# Patient Record
Sex: Female | Born: 1970 | Race: Black or African American | Hispanic: No | Marital: Single | Smoking: Never smoker
Health system: Southern US, Community
[De-identification: ages and names within clinical notes are randomized; demographics above are authoritative.]

## PROBLEM LIST (undated history)

## (undated) DIAGNOSIS — J45909 Unspecified asthma, uncomplicated: Secondary | ICD-10-CM

## (undated) DIAGNOSIS — E119 Type 2 diabetes mellitus without complications: Secondary | ICD-10-CM

## (undated) HISTORY — PX: TUBAL LIGATION: SHX77

---

## 2004-04-15 ENCOUNTER — Emergency Department: Payer: Self-pay | Admitting: Emergency Medicine

## 2006-08-04 ENCOUNTER — Emergency Department: Payer: Self-pay | Admitting: Emergency Medicine

## 2010-12-15 ENCOUNTER — Emergency Department: Payer: Self-pay | Admitting: Emergency Medicine

## 2011-10-19 ENCOUNTER — Emergency Department: Payer: Self-pay | Admitting: Emergency Medicine

## 2011-10-19 LAB — COMPREHENSIVE METABOLIC PANEL
Anion Gap: 7 (ref 7–16)
BUN: 8 mg/dL (ref 7–18)
Calcium, Total: 9 mg/dL (ref 8.5–10.1)
Chloride: 105 mmol/L (ref 98–107)
Creatinine: 0.78 mg/dL (ref 0.60–1.30)
EGFR (African American): >60
EGFR (Non-African Amer.): >60
Osmolality: 275 (ref 275–301)
SGOT(AST): 16 U/L (ref 15–37)
SGPT (ALT): 16 U/L (ref 12–78)
Sodium: 139 mmol/L (ref 136–145)
Total Protein: 8.8 g/dL — ABNORMAL HIGH (ref 6.4–8.2)

## 2011-10-19 LAB — CBC
MCH: 21.6 pg — ABNORMAL LOW (ref 26.0–34.0)
MCHC: 31.1 g/dL — ABNORMAL LOW (ref 32.0–36.0)
MCV: 70 fL — ABNORMAL LOW (ref 80–100)
Platelet: 402 10*3/uL (ref 150–440)
RBC: 4.56 10*6/uL (ref 3.80–5.20)
WBC: 10 10*3/uL (ref 3.6–11.0)

## 2011-10-19 LAB — URINALYSIS, COMPLETE
Bilirubin,UR: NEGATIVE
Blood: NEGATIVE
Glucose,UR: NEGATIVE mg/dL (ref 0–75)
Ketone: NEGATIVE
RBC,UR: NONE SEEN /HPF (ref 0–5)
Squamous Epithelial: 1
WBC UR: 1 /HPF (ref 0–5)

## 2011-10-19 LAB — LIPASE, BLOOD: Lipase: 52 U/L — ABNORMAL LOW (ref 73–393)

## 2012-02-06 ENCOUNTER — Emergency Department: Payer: Self-pay | Admitting: Emergency Medicine

## 2012-03-11 ENCOUNTER — Emergency Department: Payer: Self-pay | Admitting: Emergency Medicine

## 2012-05-11 ENCOUNTER — Emergency Department: Payer: Self-pay

## 2012-10-28 ENCOUNTER — Emergency Department: Payer: Self-pay | Admitting: Emergency Medicine

## 2012-10-28 LAB — BASIC METABOLIC PANEL
Anion Gap: 5 — ABNORMAL LOW (ref 7–16)
BUN: 8 mg/dL (ref 7–18)
Chloride: 102 mmol/L (ref 98–107)
Co2: 28 mmol/L (ref 21–32)
EGFR (African American): >60
Sodium: 135 mmol/L — ABNORMAL LOW (ref 136–145)

## 2012-10-28 LAB — URINALYSIS, COMPLETE
Bilirubin,UR: NEGATIVE
Glucose,UR: NEGATIVE mg/dL (ref 0–75)
Ketone: NEGATIVE
Nitrite: NEGATIVE
Ph: 7 (ref 4.5–8.0)
Protein: NEGATIVE
RBC,UR: NONE SEEN /HPF (ref 0–5)
Specific Gravity: 1.01 (ref 1.003–1.030)
Squamous Epithelial: 5

## 2012-10-28 LAB — CBC
HCT: 28.9 % — ABNORMAL LOW (ref 35.0–47.0)
HGB: 9 g/dL — ABNORMAL LOW (ref 12.0–16.0)
MCH: 21.1 pg — ABNORMAL LOW (ref 26.0–34.0)
MCHC: 31.2 g/dL — ABNORMAL LOW (ref 32.0–36.0)
MCV: 67 fL — ABNORMAL LOW (ref 80–100)
RDW: 19.6 % — ABNORMAL HIGH (ref 11.5–14.5)
WBC: 16.5 10*3/uL — ABNORMAL HIGH (ref 3.6–11.0)

## 2013-01-31 ENCOUNTER — Emergency Department: Payer: Self-pay | Admitting: Emergency Medicine

## 2019-03-23 ENCOUNTER — Encounter: Payer: Self-pay | Admitting: Intensive Care

## 2019-03-23 ENCOUNTER — Emergency Department: Payer: Medicaid Other

## 2019-03-23 ENCOUNTER — Other Ambulatory Visit: Payer: Self-pay

## 2019-03-23 ENCOUNTER — Emergency Department
Admission: EM | Admit: 2019-03-23 | Discharge: 2019-03-23 | Disposition: A | Discharge status: Home / Self Care | Payer: Medicaid Other | Attending: Student in an Organized Health Care Education/Training Program | Admitting: Student in an Organized Health Care Education/Training Program

## 2019-03-23 DIAGNOSIS — S93402A Sprain of unspecified ligament of left ankle, initial encounter: Secondary | ICD-10-CM

## 2019-03-23 DIAGNOSIS — Y929 Unspecified place or not applicable: Secondary | ICD-10-CM | POA: Insufficient documentation

## 2019-03-23 DIAGNOSIS — E119 Type 2 diabetes mellitus without complications: Secondary | ICD-10-CM | POA: Insufficient documentation

## 2019-03-23 DIAGNOSIS — Z7984 Long term (current) use of oral hypoglycemic drugs: Secondary | ICD-10-CM | POA: Insufficient documentation

## 2019-03-23 DIAGNOSIS — Z7982 Long term (current) use of aspirin: Secondary | ICD-10-CM | POA: Insufficient documentation

## 2019-03-23 DIAGNOSIS — Y999 Unspecified external cause status: Secondary | ICD-10-CM | POA: Insufficient documentation

## 2019-03-23 DIAGNOSIS — Y939 Activity, unspecified: Secondary | ICD-10-CM | POA: Insufficient documentation

## 2019-03-23 DIAGNOSIS — W010XXA Fall on same level from slipping, tripping and stumbling without subsequent striking against object, initial encounter: Secondary | ICD-10-CM | POA: Insufficient documentation

## 2019-03-23 DIAGNOSIS — Z79899 Other long term (current) drug therapy: Secondary | ICD-10-CM | POA: Insufficient documentation

## 2019-03-23 DIAGNOSIS — J45909 Unspecified asthma, uncomplicated: Secondary | ICD-10-CM | POA: Insufficient documentation

## 2019-03-23 HISTORY — DX: Type 2 diabetes mellitus without complications: E11.9

## 2019-03-23 HISTORY — DX: Unspecified asthma, uncomplicated: J45.909

## 2019-03-23 MED ORDER — TRAMADOL HCL 50 MG PO TABS: 50.0000 mg | ORAL_TABLET | Freq: Two times a day (BID) | ORAL | 0 refills | Status: AC | PRN

## 2019-03-23 MED ORDER — IBUPROFEN 600 MG PO TABS: 600.0000 mg | ORAL_TABLET | Freq: Three times a day (TID) | ORAL | 0 refills | Status: DC | PRN

## 2019-03-23 MED ORDER — IBUPROFEN 600 MG PO TABS
600.0000 mg | ORAL_TABLET | Freq: Once | ORAL | Status: AC
  Administered 2019-03-23: 600 mg via ORAL
  Filled 2019-03-23: qty 1

## 2019-03-23 NOTE — ED Triage Notes (Signed)
Patient reports falling earlier today injuring her left knee and ankle. Ambulatory with limp. No previous hx of knee or ankle issues

## 2019-03-23 NOTE — Discharge Instructions (Signed)
Follow discharge care instruction wear splint for 2 to 3 days as needed.

## 2019-03-23 NOTE — ED Provider Notes (Signed)
[facility] Emergency Department Provider Note   ____________________________________________   First MD Initiated Contact with Patient 03/23/19 1724     (approximate)  I have reviewed the triage vital signs and the nursing notes.   HISTORY  Chief Complaint Ankle Pain (left) and Knee Pain (left)    HPI Courtney Le is a 49 y.o. female patient complain left ankle pain secondary to a slip and fall earlier today.  Patient the pain increased with weightbearing.  Patient stated increased edema is very difficult to wear her shoe.  Patient denies loss of sensation.  Patient denies any other injury related to the fall.  Patient rates pain as 8/10.  Patient described the pain as "aching".  No palliative measure for complaint.         Past Medical History:  Diagnosis Date  . Asthma   . Diabetes mellitus without complication ([place])     There are no problems to display for this patient.   History reviewed. No pertinent surgical history.  Prior to Admission medications   Medication Sig Start Date End Date Taking? Authorizing Provider  albuterol (VENTOLIN HFA) 108 (90 Base) MCG/ACT inhaler Inhale 2 puffs into the lungs every 6 (six) hours as needed for wheezing or shortness of breath.   Yes [provider]  aspirin EC 81 MG tablet Take 81 mg by mouth daily.   Yes [provider]  atorvastatin (LIPITOR) 10 MG tablet Take 10 mg by mouth daily.   Yes [provider]  losartan (COZAAR) 100 MG tablet Take 100 mg by mouth daily.   Yes [provider]  metFORMIN (GLUCOPHAGE) 500 MG tablet Take 500 mg by mouth 2 (two) times daily with a meal.   Yes [provider]  ibuprofen (ADVIL) 600 MG tablet Take 1 tablet (600 mg total) by mouth every 8 (eight) hours as needed. 03/23/19   Sable Feil, PA-C  traMADol (ULTRAM) 50 MG tablet Take 1 tablet (50 mg total) by mouth every 12 (twelve) hours as needed for up to 3 days. 03/23/19  03/26/19  Sable Feil, PA-C    Allergies Penicillins  History reviewed. No pertinent family history.  Social History Social History   Tobacco Use  . Smoking status: Never Smoker  . Smokeless tobacco: Never Used  Substance Use Topics  . Alcohol use: Never  . Drug use: Never    Review of Systems Constitutional: No fever/chills Eyes: No visual changes. ENT: No sore throat. Cardiovascular: Denies chest pain. Respiratory: Denies shortness of breath. Gastrointestinal: No abdominal pain.  No nausea, no vomiting.  No diarrhea.  No constipation. Genitourinary: Negative for dysuria. Musculoskeletal: Left ankle pain. Skin: Negative for rash. Neurological: Negative for headaches, focal weakness or numbness. Endocrine:  Diabetes, hyperlipidemia, and hypertension. Allergic/Immunilogical: Penicillin ____________________________________________   PHYSICAL EXAM:  VITAL SIGNS: ED Triage Vitals [03/23/19 1719]  Enc Vitals Group     BP 132/76     Pulse Rate 100     Resp 14     Temp 98.6 F (37 C)     Temp Source Oral     SpO2 100 %     Weight 190 lb (86.2 kg)     Height 4\' 10"  (1.473 m)     Head Circumference      Peak Flow      Pain Score 8     Pain Loc      Pain Edu?      Excl. in [place]?  Constitutional: Alert and oriented. Well appearing and in no acute distress. Cardiovascular: Normal rate, regular rhythm. Grossly normal heart sounds.  Good peripheral circulation. Respiratory: Normal respiratory effort.  No retractions. Lungs CTAB. Musculoskeletal: No lower extremity tenderness nor edema.  No joint effusions. Neurologic:  Normal speech and language. No gross focal neurologic deficits are appreciated. No gait instability. Skin:  Skin is warm, dry and intact. No rash noted. Psychiatric: Mood and affect are normal. Speech and behavior are normal.  ____________________________________________   LABS (all labs ordered are listed, but only abnormal results are  displayed)  Labs Reviewed - No data to display ____________________________________________  EKG   ____________________________________________  RADIOLOGY  ED MD interpretation:    Official radiology report(s): DG Ankle Complete Left  Result Date: 03/23/2019 CLINICAL DATA:  Left ankle pain after fall EXAM: LEFT ANKLE COMPLETE - 3+ VIEW COMPARISON:  None. FINDINGS: There is no evidence of fracture, dislocation, or joint effusion. Small bidirectional calcaneal enthesophytes. There is no evidence of arthropathy or other focal bone abnormality. There is soft tissue swelling about the ankle. IMPRESSION: Soft tissue swelling without acute fracture or malalignment. Electronically Signed   By: Duanne Guess D.O.   On: 03/23/2019 18:21    ____________________________________________   PROCEDURES  Procedure(s) performed (including Critical Care):  Procedures   ____________________________________________   INITIAL IMPRESSION / ASSESSMENT AND PLAN / ED COURSE  As part of my medical decision making, I reviewed the following data within the electronic MEDICAL RECORD NUMBER      Patient presents with left ankle pain edema secondary to a slip and fall today.  Discussed x-ray findings with patient.  Physical exam consistent with left ankle sprain.  Patient placed in ankle stirrup splint and given discharge care instruction.  Take medication as directed.  Advised to follow-up if no improvement in 3 to 5 days.   Courtney Le was evaluated in Emergency Department on 03/23/2019 for the symptoms described in the history of present illness. She was evaluated in the context of the global COVID-19 pandemic, which necessitated consideration that the patient might be at risk for infection with the SARS-CoV-2 virus that causes COVID-19. Institutional protocols and algorithms that pertain to the evaluation of patients at risk for COVID-19 are in a state of rapid change based on information released by  regulatory bodies including the CDC and federal and state organizations. These policies and algorithms were followed during the patient's care in the ED.       ____________________________________________   FINAL CLINICAL IMPRESSION(S) / ED DIAGNOSES  Final diagnoses:  Sprain of left ankle, unspecified ligament, initial encounter     ED Discharge Orders         Ordered    ibuprofen (ADVIL) 600 MG tablet  Every 8 hours PRN     03/23/19 1830    traMADol (ULTRAM) 50 MG tablet  Every 12 hours PRN     03/23/19 1830           Note:  This document was prepared using Dragon voice recognition software and may include unintentional dictation errors.    Joni Reining, PA-C 03/23/19 1831    Willy Eddy, MD 03/23/19 1850

## 2019-03-23 NOTE — ED Notes (Signed)
See triage note  Presents s/p fall   States she fell coming out of her house  Twisted left ankle  Also has some bruising to knee  Good pulses

## 2020-07-29 ENCOUNTER — Emergency Department
Admission: EM | Admit: 2020-07-29 | Discharge: 2020-07-29 | Disposition: A | Discharge status: Home / Self Care | Payer: PRIVATE HEALTH INSURANCE | Attending: Emergency Medicine | Admitting: Emergency Medicine

## 2020-07-29 ENCOUNTER — Other Ambulatory Visit: Payer: Self-pay

## 2020-07-29 ENCOUNTER — Encounter: Payer: Self-pay | Admitting: Emergency Medicine

## 2020-07-29 ENCOUNTER — Emergency Department: Payer: PRIVATE HEALTH INSURANCE

## 2020-07-29 DIAGNOSIS — Z7984 Long term (current) use of oral hypoglycemic drugs: Secondary | ICD-10-CM | POA: Diagnosis not present

## 2020-07-29 DIAGNOSIS — J45909 Unspecified asthma, uncomplicated: Secondary | ICD-10-CM | POA: Insufficient documentation

## 2020-07-29 DIAGNOSIS — E119 Type 2 diabetes mellitus without complications: Secondary | ICD-10-CM | POA: Diagnosis not present

## 2020-07-29 DIAGNOSIS — J9801 Acute bronchospasm: Secondary | ICD-10-CM

## 2020-07-29 DIAGNOSIS — R059 Cough, unspecified: Secondary | ICD-10-CM | POA: Diagnosis present

## 2020-07-29 DIAGNOSIS — Z7982 Long term (current) use of aspirin: Secondary | ICD-10-CM | POA: Diagnosis not present

## 2020-07-29 MED ORDER — IPRATROPIUM-ALBUTEROL 0.5-2.5 (3) MG/3ML IN SOLN
3.0000 mL | Freq: Once | RESPIRATORY_TRACT | Status: AC
  Administered 2020-07-29: 3 mL via RESPIRATORY_TRACT
  Filled 2020-07-29: qty 3

## 2020-07-29 MED ORDER — METHYLPREDNISOLONE 4 MG PO TBPK: ORAL_TABLET | ORAL | 0 refills | Status: DC

## 2020-07-29 MED ORDER — PSEUDOEPH-BROMPHEN-DM 30-2-10 MG/5ML PO SYRP: 5.0000 mL | ORAL_SOLUTION | Freq: Four times a day (QID) | ORAL | 0 refills | Status: DC | PRN

## 2020-07-29 MED ORDER — ALBUTEROL SULFATE HFA 108 (90 BASE) MCG/ACT IN AERS: 2.0000 | INHALATION_SPRAY | Freq: Four times a day (QID) | RESPIRATORY_TRACT | 2 refills | Status: AC | PRN

## 2020-07-29 NOTE — Discharge Instructions (Addendum)
Read and follow discharge care instructions.  Take medication as directed.  Advised to closely monitor blood sugar while taking steroids.

## 2020-07-29 NOTE — ED Notes (Signed)
See triage note  Presents with some SOB and wheezing  States her AC went out at home on Saturday  It triggered her asthma  Has used her inhalers and svn w/o much relief

## 2020-07-29 NOTE — ED Provider Notes (Signed)
[facility] Emergency Department Provider Note   ____________________________________________   Event Date/Time   First MD Initiated Contact with Patient 07/29/20 [id number]     (approximate)  I have reviewed the triage vital signs and the nursing notes.   HISTORY  Chief Complaint Wheezing    HPI Courtney Le is a 50 y.o. female patient presents with cough and wheezing began last night.  Patient has a history of asthma.  Patient not use her inhaler.         Past Medical History:  Diagnosis Date  . Asthma   . Diabetes mellitus without complication ([facility])     There are no problems to display for this patient.   History reviewed. No pertinent surgical history.  Prior to Admission medications   Medication Sig Start Date End Date Taking? Authorizing Provider  albuterol (VENTOLIN HFA) 108 (90 Base) MCG/ACT inhaler Inhale 2 puffs into the lungs every 6 (six) hours as needed for wheezing or shortness of breath. 07/29/20  Yes Joni Reining, PA-C  brompheniramine-pseudoephedrine-DM 30-2-10 MG/5ML syrup Take 5 mLs by mouth 4 (four) times daily as needed. 07/29/20  Yes Joni Reining, PA-C  methylPREDNISolone (MEDROL DOSEPAK) 4 MG TBPK tablet Take Tapered dose as directed 07/29/20  Yes Joni Reining, PA-C  albuterol (VENTOLIN HFA) 108 (90 Base) MCG/ACT inhaler Inhale 2 puffs into the lungs every 6 (six) hours as needed for wheezing or shortness of breath.    [provider]  aspirin EC 81 MG tablet Take 81 mg by mouth daily.    [provider]  atorvastatin (LIPITOR) 10 MG tablet Take 10 mg by mouth daily.    [provider]  ibuprofen (ADVIL) 600 MG tablet Take 1 tablet (600 mg total) by mouth every 8 (eight) hours as needed. 03/23/19   Joni Reining, PA-C  losartan (COZAAR) 100 MG tablet Take 100 mg by mouth daily.    [provider]  metFORMIN (GLUCOPHAGE) 500 MG tablet Take 500 mg by mouth 2 (two) times daily with a  meal.    [provider]    Allergies Penicillins  No family history on file.  Social History Social History   Tobacco Use  . Smoking status: Never Smoker  . Smokeless tobacco: Never Used  Substance Use Topics  . Alcohol use: Never  . Drug use: Never    Review of Systems Constitutional: No fever/chills Eyes: No visual changes. ENT: No sore throat. Cardiovascular: Denies chest pain. Respiratory: Cough and wheezing. Gastrointestinal: No abdominal pain.  No nausea, no vomiting.  No diarrhea.  No constipation. Genitourinary: Negative for dysuria. Musculoskeletal: Negative for back pain. Skin: Negative for rash. Neurological: Negative for headaches, focal weakness or numbness. Endocrine:  Diabetes, hyperlipidemia, hypertension. Allergic/Immunilogical: Penicillin ____________________________________________   PHYSICAL EXAM:  VITAL SIGNS: ED Triage Vitals  Enc Vitals Group     BP 07/29/20 0733 138/90     Pulse Rate 07/29/20 0733 99     Resp 07/29/20 0733 18     Temp 07/29/20 0733 98.4 F (36.9 C)     Temp Source 07/29/20 0733 Oral     SpO2 07/29/20 0733 96 %     Weight 07/29/20 0725 190 lb 0.6 oz (86.2 kg)     Height 07/29/20 0725 4\' 10"  (1.473 m)     Head Circumference --      Peak Flow --      Pain Score 07/29/20 0725 0     Pain Loc --  Pain Edu? --      Excl. in [place]? --     Constitutional: Alert and oriented. Well appearing and in no acute distress. Eyes: Conjunctivae are normal. PERRL. EOMI. Head: Atraumatic. Nose: No congestion/rhinnorhea. Mouth/Throat: Mucous membranes are moist.  Oropharynx non-erythematous. Neck: No stridor.  Hematological/Lymphatic/Immunilogical: No cervical lymphadenopathy. Cardiovascular: Normal rate, regular rhythm. Grossly normal heart sounds.  Good peripheral circulation. Respiratory: Normal respiratory effort.  No retractions. Lungs expiratory wheezing.  Gastrointestinal: Soft and nontender.  distention secondary to  body habitus.. No abdominal bruits. No CVA tenderness. Genitourinary: Deferred Neurologic:  Normal speech and language. No gross focal neurologic deficits are appreciated. No gait instability. Skin:  Skin is warm, dry and intact. No rash noted. Psychiatric: Mood and affect are normal. Speech and behavior are normal.  ____________________________________________   LABS (all labs ordered are listed, but only abnormal results are displayed)  Labs Reviewed - No data to display ____________________________________________  EKG   ____________________________________________  RADIOLOGY I, Joni Reining, personally viewed and evaluated these images (plain radiographs) as part of my medical decision making, as well as reviewing the written report by the radiologist.  ED MD interpretation:    Official radiology report(s): DG Chest Portable 1 View  Result Date: 07/29/2020 CLINICAL DATA:  Cough and wheezing.  History of asthma. EXAM: PORTABLE CHEST 1 VIEW COMPARISON:  Radiographs 01/31/2013. FINDINGS: 0800 hours. Interval development of mild cardiac enlargement. The mediastinal contours are stable. The pulmonary vascularity is normal, and the lungs are clear. There is no pleural effusion or pneumothorax. The bones appear unremarkable. IMPRESSION: Mild cardiac enlargement.  Clear lungs. Electronically Signed   By: Carey Bullocks M.D.   On: 07/29/2020 08:35    ____________________________________________   PROCEDURES  Procedure(s) performed (including Critical Care):  Procedures   ____________________________________________   INITIAL IMPRESSION / ASSESSMENT AND PLAN / ED COURSE  As part of my medical decision making, I reviewed the following data within the electronic MEDICAL RECORD NUMBER         Patient presents with wheezing started last night.  Patient also is out of her albuterol inhaler.  Discussed no acute findings on chest x-ray.  Patient's bonded well to 1 DuoNeb treatment.   Patient given discharge care instruction advised take medication as directed.  Patient advised to follow-up with PCP.      ____________________________________________   FINAL CLINICAL IMPRESSION(S) / ED DIAGNOSES  Final diagnoses:  Bronchospasm     ED Discharge Orders         Ordered    brompheniramine-pseudoephedrine-DM 30-2-10 MG/5ML syrup  4 times daily PRN        07/29/20 0850    methylPREDNISolone (MEDROL DOSEPAK) 4 MG TBPK tablet        07/29/20 0850    albuterol (VENTOLIN HFA) 108 (90 Base) MCG/ACT inhaler  Every 6 hours PRN        07/29/20 0850          *Please note:  CORRIE REDER was evaluated in Emergency Department on 07/29/2020 for the symptoms described in the history of present illness. She was evaluated in the context of the global COVID-19 pandemic, which necessitated consideration that the patient might be at risk for infection with the SARS-CoV-2 virus that causes COVID-19. Institutional protocols and algorithms that pertain to the evaluation of patients at risk for COVID-19 are in a state of rapid change based on information released by regulatory bodies including the CDC and federal and state organizations. These policies and algorithms  were followed during the patient's care in the ED.  Some ED evaluations and interventions may be delayed as a result of limited staffing during and the pandemic.*   Note:  This document was prepared using Dragon voice recognition software and may include unintentional dictation errors.    Joni Reining, PA-C 07/29/20 9211    Dionne Bucy, MD 07/29/20 [id number]

## 2020-07-29 NOTE — ED Triage Notes (Signed)
C/O wheezing from asthma since last night.  Has not used inhaler.  AAOx3.  Skin warm and dry. NAD

## 2020-11-06 ENCOUNTER — Encounter: Payer: Self-pay | Admitting: Emergency Medicine

## 2020-11-06 ENCOUNTER — Emergency Department: Payer: PRIVATE HEALTH INSURANCE

## 2020-11-06 ENCOUNTER — Other Ambulatory Visit: Payer: Self-pay

## 2020-11-06 ENCOUNTER — Emergency Department
Admission: EM | Admit: 2020-11-06 | Discharge: 2020-11-06 | Disposition: A | Discharge status: Home / Self Care | Payer: PRIVATE HEALTH INSURANCE | Attending: Emergency Medicine | Admitting: Emergency Medicine

## 2020-11-06 DIAGNOSIS — E119 Type 2 diabetes mellitus without complications: Secondary | ICD-10-CM | POA: Insufficient documentation

## 2020-11-06 DIAGNOSIS — N898 Other specified noninflammatory disorders of vagina: Secondary | ICD-10-CM | POA: Diagnosis present

## 2020-11-06 DIAGNOSIS — Z79899 Other long term (current) drug therapy: Secondary | ICD-10-CM | POA: Insufficient documentation

## 2020-11-06 DIAGNOSIS — J45909 Unspecified asthma, uncomplicated: Secondary | ICD-10-CM | POA: Diagnosis not present

## 2020-11-06 DIAGNOSIS — B9689 Other specified bacterial agents as the cause of diseases classified elsewhere: Secondary | ICD-10-CM | POA: Insufficient documentation

## 2020-11-06 DIAGNOSIS — K5792 Diverticulitis of intestine, part unspecified, without perforation or abscess without bleeding: Secondary | ICD-10-CM | POA: Insufficient documentation

## 2020-11-06 DIAGNOSIS — R1031 Right lower quadrant pain: Secondary | ICD-10-CM

## 2020-11-06 DIAGNOSIS — A599 Trichomoniasis, unspecified: Secondary | ICD-10-CM | POA: Diagnosis not present

## 2020-11-06 DIAGNOSIS — Z7984 Long term (current) use of oral hypoglycemic drugs: Secondary | ICD-10-CM | POA: Diagnosis not present

## 2020-11-06 DIAGNOSIS — Z7982 Long term (current) use of aspirin: Secondary | ICD-10-CM | POA: Insufficient documentation

## 2020-11-06 LAB — CBC WITH DIFFERENTIAL/PLATELET
Abs Immature Granulocytes: 0.02 10*3/uL (ref 0.00–0.07)
Basophils Absolute: 0.1 10*3/uL (ref 0.0–0.1)
Basophils Relative: 1 %
Eosinophils Absolute: 0.2 10*3/uL (ref 0.0–0.5)
Eosinophils Relative: 2 %
HCT: 38.3 % (ref 36.0–46.0)
Hemoglobin: 12.5 g/dL (ref 12.0–15.0)
Immature Granulocytes: 0 %
Lymphocytes Relative: 41 %
Lymphs Abs: 4.4 10*3/uL — ABNORMAL HIGH (ref 0.7–4.0)
MCH: 27.3 pg (ref 26.0–34.0)
MCHC: 32.6 g/dL (ref 30.0–36.0)
MCV: 83.6 fL (ref 80.0–100.0)
Monocytes Absolute: 1 10*3/uL (ref 0.1–1.0)
Monocytes Relative: 9 %
Neutro Abs: 5.1 10*3/uL (ref 1.7–7.7)
Neutrophils Relative %: 47 %
Platelets: 377 10*3/uL (ref 150–400)
RBC: 4.58 MIL/uL (ref 3.87–5.11)
RDW: 15.4 % (ref 11.5–15.5)
WBC: 10.7 10*3/uL — ABNORMAL HIGH (ref 4.0–10.5)
nRBC: 0 % (ref 0.0–0.2)

## 2020-11-06 LAB — URINALYSIS, ROUTINE W REFLEX MICROSCOPIC
Bilirubin Urine: NEGATIVE
Glucose, UA: 150 mg/dL — AB
Hgb urine dipstick: NEGATIVE
Ketones, ur: NEGATIVE mg/dL
Nitrite: NEGATIVE
Protein, ur: NEGATIVE mg/dL
Specific Gravity, Urine: 1.017 (ref 1.005–1.030)
pH: 8 (ref 5.0–8.0)

## 2020-11-06 LAB — CHLAMYDIA/NGC RT PCR (ARMC ONLY)
Chlamydia Tr: NOT DETECTED
N gonorrhoeae: NOT DETECTED

## 2020-11-06 LAB — COMPREHENSIVE METABOLIC PANEL
ALT: 14 U/L (ref 0–44)
AST: 12 U/L — ABNORMAL LOW (ref 15–41)
Albumin: 3.6 g/dL (ref 3.5–5.0)
Alkaline Phosphatase: 57 U/L (ref 38–126)
Anion gap: 10 (ref 5–15)
BUN: 9 mg/dL (ref 6–20)
CO2: 29 mmol/L (ref 22–32)
Calcium: 9.6 mg/dL (ref 8.9–10.3)
Chloride: 101 mmol/L (ref 98–111)
Creatinine, Ser: 0.73 mg/dL (ref 0.44–1.00)
GFR, Estimated: >60 mL/min (ref >60)
Glucose, Bld: 79 mg/dL (ref 70–99)
Potassium: 4.3 mmol/L (ref 3.5–5.1)
Sodium: 140 mmol/L (ref 135–145)
Total Bilirubin: 0.5 mg/dL (ref 0.3–1.2)
Total Protein: 8.2 g/dL — ABNORMAL HIGH (ref 6.5–8.1)

## 2020-11-06 LAB — WET PREP, GENITAL
Clue Cells Wet Prep HPF POC: NONE SEEN
Sperm: NONE SEEN
Yeast Wet Prep HPF POC: NONE SEEN

## 2020-11-06 LAB — PREGNANCY, URINE: Preg Test, Ur: NEGATIVE

## 2020-11-06 MED ORDER — IOHEXOL 350 MG/ML SOLN
100.0000 mL | Freq: Once | INTRAVENOUS | Status: AC | PRN
  Administered 2020-11-06: 100 mL via INTRAVENOUS

## 2020-11-06 MED ORDER — HYDROCODONE-ACETAMINOPHEN 5-325 MG PO TABS: 1.0000 | ORAL_TABLET | Freq: Four times a day (QID) | ORAL | 0 refills | Status: DC | PRN

## 2020-11-06 MED ORDER — METRONIDAZOLE 500 MG PO TABS: 500.0000 mg | ORAL_TABLET | Freq: Three times a day (TID) | ORAL | 0 refills | Status: AC

## 2020-11-06 MED ORDER — CIPROFLOXACIN HCL 500 MG PO TABS: 500.0000 mg | ORAL_TABLET | Freq: Two times a day (BID) | ORAL | 0 refills | Status: AC

## 2020-11-06 NOTE — ED Provider Notes (Signed)
[facility] Emergency Department Provider Note   ____________________________________________   Event Date/Time   First MD Initiated Contact with Patient 11/06/20 1040     (approximate)  I have reviewed the triage vital signs and the nursing notes.   HISTORY  Chief Complaint Abdominal Pain and Back Pain    HPI CHELA SUTPHEN is a 50 y.o. female who reports she had a boil that burst and gave her some discharge and itching this is gotten better but she still has a little discharge the boil was on the left side of her introitus as I understand and now she has some pain on the right extreme right lower quadrant of the abdomen.  Is tender to palpation but not through percussion.  There is no pain with rebound.  She still has a discharge present.  Pain is deep and achy occasionally sharp and radiates toward the back.  There is no CVA tenderness.  She has no dysuria urgency or frequency.  She has no diarrhea nausea vomiting or other complaints.     Past Medical History:  Diagnosis Date   Asthma    Diabetes mellitus without complication (HCC)     There are no problems to display for this patient.   History reviewed. No pertinent surgical history.  Prior to Admission medications   Medication Sig Start Date End Date Taking? Authorizing Provider  ciprofloxacin (CIPRO) 500 MG tablet Take 1 tablet (500 mg total) by mouth 2 (two) times daily for 10 days. 11/06/20 11/16/20 Yes Arnaldo Natal, MD  HYDROcodone-acetaminophen (NORCO/VICODIN) 5-325 MG tablet Take 1 tablet by mouth every 6 (six) hours as needed for moderate pain. 11/06/20  Yes Arnaldo Natal, MD  metroNIDAZOLE (FLAGYL) 500 MG tablet Take 1 tablet (500 mg total) by mouth 3 (three) times daily for 7 days. 11/06/20 11/13/20 Yes Arnaldo Natal, MD  albuterol (VENTOLIN HFA) 108 (90 Base) MCG/ACT inhaler Inhale 2 puffs into the lungs every 6 (six) hours as needed for wheezing or shortness of breath.    [provider]  albuterol (VENTOLIN HFA) 108 (90 Base) MCG/ACT inhaler Inhale 2 puffs into the lungs every 6 (six) hours as needed for wheezing or shortness of breath. 07/29/20   Joni Reining, PA-C  aspirin EC 81 MG tablet Take 81 mg by mouth daily.    [provider]  atorvastatin (LIPITOR) 10 MG tablet Take 10 mg by mouth daily.    [provider]  brompheniramine-pseudoephedrine-DM 30-2-10 MG/5ML syrup Take 5 mLs by mouth 4 (four) times daily as needed. 07/29/20   Joni Reining, PA-C  ibuprofen (ADVIL) 600 MG tablet Take 1 tablet (600 mg total) by mouth every 8 (eight) hours as needed. 03/23/19   Joni Reining, PA-C  losartan (COZAAR) 100 MG tablet Take 100 mg by mouth daily.    [provider]  metFORMIN (GLUCOPHAGE) 500 MG tablet Take 500 mg by mouth 2 (two) times daily with a meal.    [provider]  methylPREDNISolone (MEDROL DOSEPAK) 4 MG TBPK tablet Take Tapered dose as directed 07/29/20   Joni Reining, PA-C    Allergies Penicillins  No family history on file.  Social History Social History   Tobacco Use   Smoking status: Never   Smokeless tobacco: Never  Substance Use Topics   Alcohol use: Never   Drug use: Never    Review of Systems  Constitutional: No fever/chills Eyes: No visual changes. ENT: No sore throat.  Cardiovascular: Denies chest pain. Respiratory: Denies shortness of breath. Gastrointestinal: See HPI Genitourinary: Negative for dysuria. Musculoskeletal: Negative for back pain.  But pain radiates to the back and right lower part of the back Skin: Negative for rash. Neurological: Negative for headaches, focal weakness   ____________________________________________   PHYSICAL EXAM:  VITAL SIGNS: ED Triage Vitals  Enc Vitals Group     BP 11/06/20 0905 (!) 146/96     Pulse Rate 11/06/20 0905 (!) 106     Resp 11/06/20 0905 18     Temp 11/06/20 0905 98.5 F (36.9 C)     Temp Source 11/06/20 0905 Oral      SpO2 11/06/20 0905 99 %     Weight 11/06/20 0906 228 lb 9.6 oz (103.7 kg)     Height 11/06/20 0906 4\' 11"  (1.499 m)     Head Circumference --      Peak Flow --      Pain Score 11/06/20 0906 2     Pain Loc --      Pain Edu? --      Excl. in [place]? --    Constitutional: Alert and oriented. Well appearing and in no acute distress. Eyes: Conjunctivae are normal Head: Atraumatic. Nose: No congestion/rhinnorhea. Mouth/Throat: Mucous membranes are moist.  Oropharynx non-erythematous. Neck: No stridor.   Cardiovascular: Normal rate, regular rhythm. Grossly normal heart sounds.  Good peripheral circulation. Respiratory: Normal respiratory effort.  No retractions. Lungs CTAB. Gastrointestinal: Soft and nontender except for palpation in the right extreme lower quadrant just above the pubic bone. No distention. No abdominal bruits. No CVA tenderness. Genitourinary: Normal perineum and vagina.  There is a small amount of yellowish discharge.  There is tenderness to palpation on the bimanual on the right side.  There is no tenderness elsewhere.  I cannot get a good enough exam to determine if there is any masses. Musculoskeletal: No lower extremity tenderness nor edema.   Neurologic:  Normal speech and language. No gross focal neurologic deficits are appreciated. No gait instability. Skin:  Skin is warm, dry and intact. No rash noted. .  ____________________________________________   LABS (all labs ordered are listed, but only abnormal results are displayed)  Labs Reviewed  WET PREP, GENITAL - Abnormal; Notable for the following components:      Result Value   Trich, Wet Prep PRESENT (*)    WBC, Wet Prep HPF POC MANY (*)    All other components within normal limits  URINALYSIS, ROUTINE W REFLEX MICROSCOPIC - Abnormal; Notable for the following components:   Color, Urine YELLOW (*)    APPearance HAZY (*)    Glucose, UA 150 (*)    Leukocytes,Ua TRACE (*)    Bacteria, UA RARE (*)    All other  components within normal limits  CBC WITH DIFFERENTIAL/PLATELET - Abnormal; Notable for the following components:   WBC 10.7 (*)    Lymphs Abs 4.4 (*)    All other components within normal limits  COMPREHENSIVE METABOLIC PANEL - Abnormal; Notable for the following components:   Total Protein 8.2 (*)    AST 12 (*)    All other components within normal limits  CHLAMYDIA/NGC RT PCR ([facility] ONLY)            PREGNANCY, URINE   ____________________________________________  EKG   ____________________________________________  RADIOLOGY 11/08/20, personally viewed and evaluated these images (plain radiographs) as part of my medical decision making, as well as reviewing the written report by  the radiologist.  ED MD interpretation: Ultrasound shows no cause for the patient's pain.  Radiology re read the film and I reviewed it.  CT shows diverticulitis.  This could explain the patient's pain.  I reviewed the CT as well.  Official radiology report(s): CT ABDOMEN PELVIS W CONTRAST  Result Date: 11/06/2020 CLINICAL DATA:  Right lower quadrant abdominal pain. Abdominal abscess/infection suspected EXAM: CT ABDOMEN AND PELVIS WITH CONTRAST TECHNIQUE: Multidetector CT imaging of the abdomen and pelvis was performed using the standard protocol following bolus administration of intravenous contrast. CONTRAST:  100mL OMNIPAQUE IOHEXOL 350 MG/ML SOLN COMPARISON:  Pelvic ultrasound 11/06/2020 FINDINGS: Lower chest: The lung bases are clear without focal nodule, mass, or airspace disease. Heart size is normal. No significant pleural or pericardial effusion is present. Hepatobiliary: No focal liver abnormality is seen. No gallstones, gallbladder wall thickening, or biliary dilatation. Pancreas: Unremarkable. No pancreatic ductal dilatation or surrounding inflammatory changes. Spleen: Normal in size without focal abnormality. Adrenals/Urinary Tract: Adrenal glands are unremarkable. Kidneys are normal,  without renal calculi, focal lesion, or hydronephrosis. Bladder is unremarkable. Stomach/Bowel: Stomach and duodenum are within normal limits. Small bowel is unremarkable. Terminal ileum is within normal limits. The appendix is visualized and the ascending and transverse colon are within normal limits. Diverticular changes are present in the distal descending and sigmoid colon. Inflammatory changes are present about the proximal sigmoid colon. No free air or free fluid is present. Vascular/Lymphatic: Negative Reproductive: Uterus and adnexa are within limits.  IUD is in place. Other: No abdominal wall hernia or abnormality. No abdominopelvic ascites. Musculoskeletal: Vertebral body heights and alignment are normal. Osseous lesion present. Bony pelvis the hips are located and normal. IMPRESSION: 1. Sigmoid diverticulosis with inflammatory changes about the proximal sigmoid colon suggesting acute diverticulitis. No abscess or free air is present. 2. Normal appearance of the appendix. 3. IUD in place. Electronically Signed   By: Marin Robertshristopher  Mattern M.D.   On: 11/06/2020 15:00   US PELVIC COMPLETE WITH TRANSVAGINAL  Result Date: 11/06/2020 CLINICAL DATA:  Right lower quadrant pain.  IUD. EXAM: TRANSABDOMINAL AND TRANSVAGINAL ULTRASOUND OF PELVIS TECHNIQUE: Both transabdominal and transvaginal ultrasound examinations of the pelvis were performed. Transabdominal technique was performed for global imaging of the pelvis including uterus, ovaries, adnexal regions, and pelvic cul-de-sac. It was necessary to proceed with endovaginal exam following the transabdominal exam to visualize the uterus, endometrium and ovaries. COMPARISON:  None FINDINGS: Uterus Measurements: 7.5 x 4.1 x 5.1 cm = volume: 84 mm mL. Right anterior intramural fibroid measures 1.8 x 1.8 x 1.7 cm. Endometrium Thickness: 3 mm. IUD is identified within the endometrial cavity and appears to be appropriately positioned. No focal abnormality visualized.  Right ovary Measurements: Not visualized due to overlying gas. Left ovary Measurements: Not visualized due to overlying gas Other findings Mild irregularity of the bladder wall noted. IMPRESSION: 1. No acute abnormality. 2. Right anterior intramural fibroid. 3. IUD appears appropriately positioned. 4. Mild bladder wall irregularity. Correlate for any clinical signs or symptoms of cystitis. Electronically Signed   By: Signa Kellaylor  Stroud M.D.   On: 11/06/2020 13:44    ____________________________________________   PROCEDURES  Procedure(s) performed (including Critical Care):  Procedures   ____________________________________________   INITIAL IMPRESSION / ASSESSMENT AND PLAN / ED COURSE We will treat patient's trichomonas and diverticulitis with Cipro and Flagyl.  This should cover everything.  Patient will return if she is worse or no better in 3 to 4 days.  There is no sign of appendicitis or  ovarian torsion or cyst or ectopic pregnancy or any other likely cause of right lower quadrant pain to include hernia.               ____________________________________________   FINAL CLINICAL IMPRESSION(S) / ED DIAGNOSES  Final diagnoses:  Right lower quadrant abdominal pain  Diverticulitis  Trichomonas infection     ED Discharge Orders          Ordered    ciprofloxacin (CIPRO) 500 MG tablet  2 times daily        11/06/20 1513    metroNIDAZOLE (FLAGYL) 500 MG tablet  3 times daily        11/06/20 1513    HYDROcodone-acetaminophen (NORCO/VICODIN) 5-325 MG tablet  Every 6 hours PRN        11/06/20 1513             Note:  This document was prepared using Dragon voice recognition software and may include unintentional dictation errors.    Arnaldo Natal, MD 11/06/20 1515

## 2020-11-06 NOTE — Discharge Instructions (Addendum)
You have diverticulitis and a trichomonas infection.  I have included preprinted instructions for both of these.  I will have you take Cipro antibiotic 1 twice a day and Flagyl 1 3 times a day.  Make sure you are not drinking any alcohol with the Flagyl because it can make you sick that way.  The Flagyl should take care of the trichomonas infection as well.  Please return for increasing pain fever or feeling sicker.  I will give you a couple pain pills to take 1 pill 4 times a day as needed for day or 2.  Be careful not to drive or operate any hazardous machinery on these as it can make you woozy.  You can fall to this to be careful.  Please return if you have worse pain, fever, or vomiting or feel sicker at all.

## 2020-11-06 NOTE — ED Triage Notes (Signed)
Pt reports that she is having lower pelvic pain that radiates to her back. She feels like she has to urinate but isnt going much. She has seen some discharge down there. She has had a boil down there and when it bust is when she noticed the discharge. This has been going on for a week.

## 2020-11-06 NOTE — ED Notes (Signed)
Patient to US at this time.

## 2020-12-20 ENCOUNTER — Emergency Department
Admission: EM | Admit: 2020-12-20 | Discharge: 2020-12-20 | Disposition: A | Discharge status: Home / Self Care | Payer: PRIVATE HEALTH INSURANCE | Attending: Emergency Medicine | Admitting: Emergency Medicine

## 2020-12-20 DIAGNOSIS — T7840XA Allergy, unspecified, initial encounter: Secondary | ICD-10-CM | POA: Diagnosis present

## 2020-12-20 DIAGNOSIS — R609 Edema, unspecified: Secondary | ICD-10-CM | POA: Diagnosis not present

## 2020-12-20 DIAGNOSIS — J45909 Unspecified asthma, uncomplicated: Secondary | ICD-10-CM | POA: Diagnosis not present

## 2020-12-20 DIAGNOSIS — Z7982 Long term (current) use of aspirin: Secondary | ICD-10-CM | POA: Diagnosis not present

## 2020-12-20 DIAGNOSIS — Z7984 Long term (current) use of oral hypoglycemic drugs: Secondary | ICD-10-CM | POA: Insufficient documentation

## 2020-12-20 DIAGNOSIS — E119 Type 2 diabetes mellitus without complications: Secondary | ICD-10-CM | POA: Diagnosis not present

## 2020-12-20 MED ORDER — PREDNISONE 20 MG PO TABS: 40.0000 mg | ORAL_TABLET | Freq: Every day | ORAL | 0 refills | Status: AC

## 2020-12-20 MED ORDER — SODIUM CHLORIDE 0.9 % IV BOLUS
1000.0000 mL | Freq: Once | INTRAVENOUS | Status: AC
  Administered 2020-12-20: 1000 mL via INTRAVENOUS

## 2020-12-20 MED ORDER — METHYLPREDNISOLONE SODIUM SUCC 125 MG IJ SOLR
125.0000 mg | Freq: Once | INTRAMUSCULAR | Status: AC
  Administered 2020-12-20: 125 mg via INTRAVENOUS
  Filled 2020-12-20: qty 2

## 2020-12-20 MED ORDER — FAMOTIDINE IN NACL 20-0.9 MG/50ML-% IV SOLN
20.0000 mg | Freq: Once | INTRAVENOUS | Status: AC
  Administered 2020-12-20: 20 mg via INTRAVENOUS
  Filled 2020-12-20: qty 50

## 2020-12-20 MED ORDER — EPINEPHRINE 0.3 MG/0.3ML IJ SOAJ
INTRAMUSCULAR | Status: AC
  Administered 2020-12-20: 0.3 mg
  Filled 2020-12-20: qty 0.3

## 2020-12-20 NOTE — ED Provider Notes (Signed)
[facility] Emergency Department Provider Note   ____________________________________________   I have reviewed the triage vital signs and the nursing notes.   HISTORY  Chief Complaint Allergic reaction   History limited by: Not Limited   HPI Courtney Le is a 50 y.o. female who presents to the emergency department today because of concerns for possible allergic reaction.  Patient states that when she ate lunch today she noticed that her drink tasted a little funny.  Few hours later she started appreciating swelling to her lips and somewhat to her throat as well.  Patient does have a history of allergies and has an EpiPen although did not use it.  She went home and took a Benadryl.  The time my exam she states that the throat is feeling somewhat better although she still feels the swelling in her face.  Denies any hives or itching.  No nausea or vomiting.  States that she has had similar symptoms in the past.   Records reviewed. Per medical record review patient has a history of asthma, DM.  Past Medical History:  Diagnosis Date   Asthma    Diabetes mellitus without complication (HCC)     There are no problems to display for this patient.   No past surgical history on file.  Prior to Admission medications   Medication Sig Start Date End Date Taking? Authorizing Provider  albuterol (VENTOLIN HFA) 108 (90 Base) MCG/ACT inhaler Inhale 2 puffs into the lungs every 6 (six) hours as needed for wheezing or shortness of breath. 07/29/20  Yes Joni Reining, PA-C  aspirin EC 81 MG tablet Take 81 mg by mouth daily.   Yes [provider]  atorvastatin (LIPITOR) 10 MG tablet Take 10 mg by mouth daily.   Yes [provider]  losartan (COZAAR) 100 MG tablet Take 100 mg by mouth daily.   Yes [provider]  metFORMIN (GLUCOPHAGE-XR) 500 MG 24 hr tablet Take 1,000 mg by mouth 2 (two) times daily. 09/30/20  Yes [provider]   montelukast (SINGULAIR) 10 MG tablet Take 10 mg by mouth at bedtime. 10/28/20  Yes [provider]  HYDROcodone-acetaminophen (NORCO/VICODIN) 5-325 MG tablet Take 1 tablet by mouth every 6 (six) hours as needed for moderate pain. Patient not taking: No sig reported 11/06/20   Arnaldo Natal, MD    Allergies Penicillins  No family history on file.  Social History Social History   Tobacco Use   Smoking status: Never   Smokeless tobacco: Never  Substance Use Topics   Alcohol use: Never   Drug use: Never    Review of Systems Constitutional: No fever/chills Eyes: No visual changes. ENT: Positive for throat and lip swelling.  Cardiovascular: Denies chest pain. Respiratory: Denies shortness of breath. Gastrointestinal: No abdominal pain.  No nausea, no vomiting.  No diarrhea.   Genitourinary: Negative for dysuria. Musculoskeletal: Negative for back pain. Skin: Negative for rash. Neurological: Negative for headaches, focal weakness or numbness.  ____________________________________________   PHYSICAL EXAM:  VITAL SIGNS: ED Triage Vitals [12/20/20 1853]  Enc Vitals Group     BP (!) 161/95     Pulse Rate 100     Resp 20     Temp 98.4 F (36.9 C)     Temp Source Oral     SpO2 100 %     Weight      Height      Head Circumference      Peak Flow  Pain Score 0   Constitutional: Alert and oriented.  Eyes: Conjunctivae are normal.  ENT      Head: Normocephalic and atraumatic.      Nose: No congestion/rhinnorhea.      Mouth/Throat: Lip swelling.       Neck: No stridor. Hematological/Lymphatic/Immunilogical: No cervical lymphadenopathy. Cardiovascular: Normal rate, regular rhythm.  No murmurs, rubs, or gallops.  Respiratory: Normal respiratory effort without tachypnea nor retractions. Breath sounds are clear and equal bilaterally. No wheezes/rales/rhonchi. Gastrointestinal: Soft and non tender. No rebound. No guarding.  Genitourinary:  Deferred Musculoskeletal: Normal range of motion in all extremities. No lower extremity edema. Neurologic:  Normal speech and language. No gross focal neurologic deficits are appreciated.  Skin:  Skin is warm, dry and intact. No rash noted. Psychiatric: Mood and affect are normal. Speech and behavior are normal. Patient exhibits appropriate insight and judgment.  ____________________________________________    LABS (pertinent positives/negatives)  None  ____________________________________________   EKG  None  ____________________________________________    RADIOLOGY  None  ____________________________________________   PROCEDURES  Procedures  ____________________________________________   INITIAL IMPRESSION / ASSESSMENT AND PLAN / ED COURSE  Pertinent labs & imaging results that were available during my care of the patient were reviewed by me and considered in my medical decision making (see chart for details).   Patient presented to the emergency department today because of concerns for a lip and some throat swelling and possible allergic reaction.  On exam there is some noticeable lip swelling.  No hives.  I did have a discussion with the patient about possibly administering epi however she did not want to try other medications first.  Did give patient Pepcid and Solu-Medrol IV.  She had already take some Benadryl at home.  She was then observed in the emergency department for a number of hours without any worsening symptoms.  She did feel better.  Will plan on discharging.  Will give patient prescription for steroids for the next few days. ____________________________________________   FINAL CLINICAL IMPRESSION(S) / ED DIAGNOSES  Final diagnoses:  Allergic reaction, initial encounter     Note: This dictation was prepared with Dragon dictation. Any transcriptional errors that result from this process are unintentional     Phineas Semen, MD 12/20/20  [id number]

## 2020-12-20 NOTE — ED Triage Notes (Signed)
Started today

## 2020-12-20 NOTE — Discharge Instructions (Addendum)
Please seek medical attention for any high fevers, chest pain, shortness of breath, change in behavior, persistent vomiting, bloody stool or any other new or concerning symptoms.  

## 2020-12-20 NOTE — ED Triage Notes (Signed)
Pt to ED with allergic reaction. Pt has angioedema, reaction started about 1 hour PTA. Pt took benadryl

## 2021-03-14 ENCOUNTER — Emergency Department
Admission: EM | Admit: 2021-03-14 | Discharge: 2021-03-14 | Disposition: A | Discharge status: Home / Self Care | Payer: PRIVATE HEALTH INSURANCE | Source: Home / Self Care

## 2021-03-26 ENCOUNTER — Emergency Department
Admission: EM | Admit: 2021-03-26 | Discharge: 2021-03-26 | Disposition: A | Discharge status: Home / Self Care | Payer: PRIVATE HEALTH INSURANCE | Attending: Emergency Medicine | Admitting: Emergency Medicine

## 2021-03-26 ENCOUNTER — Encounter: Payer: Self-pay | Admitting: Emergency Medicine

## 2021-03-26 ENCOUNTER — Other Ambulatory Visit: Payer: Self-pay

## 2021-03-26 ENCOUNTER — Emergency Department: Payer: PRIVATE HEALTH INSURANCE

## 2021-03-26 DIAGNOSIS — Z20822 Contact with and (suspected) exposure to covid-19: Secondary | ICD-10-CM | POA: Diagnosis not present

## 2021-03-26 DIAGNOSIS — R0602 Shortness of breath: Secondary | ICD-10-CM | POA: Diagnosis present

## 2021-03-26 DIAGNOSIS — J4521 Mild intermittent asthma with (acute) exacerbation: Secondary | ICD-10-CM | POA: Diagnosis not present

## 2021-03-26 LAB — CBC
HCT: 37.5 % (ref 36.0–46.0)
Hemoglobin: 11.8 g/dL — ABNORMAL LOW (ref 12.0–15.0)
MCH: 26.2 pg (ref 26.0–34.0)
MCHC: 31.5 g/dL (ref 30.0–36.0)
MCV: 83.1 fL (ref 80.0–100.0)
Platelets: 384 10*3/uL (ref 150–400)
RBC: 4.51 MIL/uL (ref 3.87–5.11)
RDW: 15 % (ref 11.5–15.5)
WBC: 9.3 10*3/uL (ref 4.0–10.5)
nRBC: 0 % (ref 0.0–0.2)

## 2021-03-26 LAB — BASIC METABOLIC PANEL
Anion gap: 8 (ref 5–15)
BUN: 11 mg/dL (ref 6–20)
CO2: 28 mmol/L (ref 22–32)
Calcium: 9.1 mg/dL (ref 8.9–10.3)
Chloride: 102 mmol/L (ref 98–111)
Creatinine, Ser: 0.62 mg/dL (ref 0.44–1.00)
GFR, Estimated: >60 mL/min (ref >60)
Glucose, Bld: 290 mg/dL — ABNORMAL HIGH (ref 70–99)
Potassium: 4.4 mmol/L (ref 3.5–5.1)
Sodium: 138 mmol/L (ref 135–145)

## 2021-03-26 LAB — RESP PANEL BY RT-PCR (FLU A&B, COVID) ARPGX2
Influenza A by PCR: NEGATIVE
Influenza B by PCR: NEGATIVE
SARS Coronavirus 2 by RT PCR: NEGATIVE

## 2021-03-26 MED ORDER — ALBUTEROL SULFATE (2.5 MG/3ML) 0.083% IN NEBU
2.5000 mg | INHALATION_SOLUTION | Freq: Once | RESPIRATORY_TRACT | Status: AC
  Administered 2021-03-26: 2.5 mg via RESPIRATORY_TRACT
  Filled 2021-03-26: qty 3

## 2021-03-26 MED ORDER — PREDNISONE 20 MG PO TABS: 40.0000 mg | ORAL_TABLET | Freq: Every day | ORAL | 0 refills | Status: AC

## 2021-03-26 MED ORDER — PREDNISONE 20 MG PO TABS
60.0000 mg | ORAL_TABLET | Freq: Once | ORAL | Status: AC
  Administered 2021-03-26: 60 mg via ORAL
  Filled 2021-03-26: qty 3

## 2021-03-26 MED ORDER — DOXYCYCLINE HYCLATE 100 MG PO CAPS
100.0000 mg | ORAL_CAPSULE | Freq: Two times a day (BID) | ORAL | 0 refills | Status: AC
Start: 2021-03-26 — End: 2021-04-05

## 2021-03-26 MED ORDER — IPRATROPIUM-ALBUTEROL 0.5-2.5 (3) MG/3ML IN SOLN
3.0000 mL | Freq: Once | RESPIRATORY_TRACT | Status: AC
  Administered 2021-03-26: 3 mL via RESPIRATORY_TRACT
  Filled 2021-03-26: qty 3

## 2021-03-26 MED ORDER — GUAIFENESIN ER 600 MG PO TB12: 600.0000 mg | ORAL_TABLET | Freq: Two times a day (BID) | ORAL | 0 refills | Status: AC

## 2021-03-26 MED ORDER — ALBUTEROL SULFATE (2.5 MG/3ML) 0.083% IN NEBU
5.0000 mg | INHALATION_SOLUTION | Freq: Once | RESPIRATORY_TRACT | Status: AC
Start: 2021-03-26 — End: 2021-03-26
  Administered 2021-03-26: 5 mg via RESPIRATORY_TRACT
  Filled 2021-03-26: qty 6

## 2021-03-26 MED ORDER — ALBUTEROL SULFATE HFA 108 (90 BASE) MCG/ACT IN AERS: 2.0000 | INHALATION_SPRAY | RESPIRATORY_TRACT | 0 refills | Status: AC | PRN

## 2021-03-26 NOTE — Discharge Instructions (Signed)
Your chest x-ray and labs today were okay.  Your COVID and flu test were negative.

## 2021-03-26 NOTE — ED Triage Notes (Signed)
Pt comes into the ED via POV c/o asthma and SHOB.  Pt states it started yesterday and she has had no relief with her home inhalers and nebs.  Pt does present SHOB at this time.  Pt still able to ambulate to triage independently.

## 2021-03-26 NOTE — ED Provider Notes (Signed)
[facility] Provider Note    Event Date/Time   First MD Initiated Contact with Patient 03/26/21 1045     (approximate)   History   Shortness of Breath   HPI  Courtney Le is a 51 y.o. female   with a history of asthma who comes ED complaining of shortness of breath that started about 4 days ago, gradual onset, constant and worsening.  Yesterday felt much worse.  Tried using her home albuterol nebulizer without relief.  She also has some mild nonproductive cough.  No chest pain or fever.  No exertional symptoms.  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      Physical Exam   Triage Vital Signs: ED Triage Vitals  Enc Vitals Group     BP 03/26/21 1021 (!) 148/98     Pulse Rate 03/26/21 1021 (!) 110     Resp 03/26/21 1021 (!) 24     Temp 03/26/21 1021 98.3 F (36.8 C)     Temp Source 03/26/21 1021 Oral     SpO2 03/26/21 1021 96 %     Weight 03/26/21 1022 228 lb 9.9 oz (103.7 kg)     Height 03/26/21 1022 4\' 11"  (1.499 m)     Head Circumference --      Peak Flow --      Pain Score 03/26/21 1022 0     Pain Loc --      Pain Edu? --      Excl. in [place]? --     Most recent vital signs: Vitals:   03/26/21 1021  BP: (!) 148/98  Pulse: (!) 110  Resp: (!) 24  Temp: 98.3 F (36.8 C)  SpO2: 96%     General: Awake, no distress.  CV:  Good peripheral perfusion. RRR, nl radial pulse Resp:  Normal effort. Mild tachypnea.  Mild expiratory wheezing which is accentuated with FEV1 maneuver provoking prolonged expiratory phase and cough. Abd:  No distention.  Other:  No lower extremity edema or tenderness.   ED Results / Procedures / Treatments   Labs (all labs ordered are listed, but only abnormal results are displayed) Labs Reviewed  BASIC METABOLIC PANEL - Abnormal; Notable for the following components:      Result Value   Glucose, Bld 290 (*)    All other components within normal limits   CBC - Abnormal; Notable for the following components:   Hemoglobin 11.8 (*)    All other components within normal limits  RESP PANEL BY RT-PCR (FLU A&B, COVID) [id number]     EKG  Interpreted by me Sinus tachycardia rate 102.  Normal axis and intervals.  Poor R wave progression.  Normal ST segments and T waves.  No acute ischemic changes.  No evidence of right heart strain.   RADIOLOGY Chest x-ray viewed and interpreted by me, appears unremarkable without edema consolidation or pneumothorax.  Radiology report reviewed    PROCEDURES:  Critical Care performed: No  Procedures   MEDICATIONS ORDERED IN ED: Medications  albuterol (PROVENTIL) (2.5 MG/3ML) 0.083% nebulizer solution 2.5 mg (2.5 mg Nebulization Given 03/26/21 1026)  predniSONE (DELTASONE) tablet 60 mg (60 mg Oral Given 03/26/21 1054)  ipratropium-albuterol (DUONEB) 0.5-2.5 (3) MG/3ML nebulizer solution 3 mL (3 mLs Nebulization Given 03/26/21 1056)  albuterol (PROVENTIL) (2.5 MG/3ML) 0.083% nebulizer solution 5 mg (5 mg Nebulization Given 03/26/21 1055)     IMPRESSION / MDM / ASSESSMENT AND  PLAN / ED COURSE  I reviewed the triage vital signs and the nursing notes.                              Differential diagnosis includes, but is not limited to, asthma exacerbation, pneumonia, viral illness, pleural effusion, pneumothorax     Patient presents with shortness of breath, most likely asthma exacerbation.   Considering the patient's symptoms, medical history, and physical examination today, I have low suspicion for ACS, PE, TAD, pneumothorax, carditis, mediastinitis, pneumonia, CHF, or sepsis.  Labs are reassuring, chest x-ray reassuring.  COVID and flu negative.  Feeling better after receiving steroids and bronchodilators in the ED.  We will continue these along with doxycycline as outpatient.  Stable for discharge.      FINAL CLINICAL IMPRESSION(S) / ED DIAGNOSES   Final diagnoses:  Exacerbation of intermittent  asthma, unspecified asthma severity     Rx / DC Orders   ED Discharge Orders          Ordered    predniSONE (DELTASONE) 20 MG tablet  Daily with breakfast        03/26/21 1205    guaiFENesin (MUCINEX) 600 MG 12 hr tablet  2 times daily        03/26/21 1205    doxycycline (VIBRAMYCIN) 100 MG capsule  2 times daily        03/26/21 1205    albuterol (PROVENTIL HFA) 108 (90 Base) MCG/ACT inhaler  Every 4 hours PRN        03/26/21 1205             Note:  This document was prepared using Dragon voice recognition software and may include unintentional dictation errors.   Carrie Mew, MD 03/26/21 [phone]

## 2021-05-30 ENCOUNTER — Encounter: Payer: Self-pay | Admitting: Emergency Medicine

## 2021-05-30 ENCOUNTER — Emergency Department
Admission: EM | Admit: 2021-05-30 | Discharge: 2021-05-30 | Disposition: A | Discharge status: Home / Self Care | Payer: PRIVATE HEALTH INSURANCE | Attending: Emergency Medicine | Admitting: Emergency Medicine

## 2021-05-30 ENCOUNTER — Emergency Department: Payer: PRIVATE HEALTH INSURANCE

## 2021-05-30 ENCOUNTER — Other Ambulatory Visit: Payer: Self-pay

## 2021-05-30 DIAGNOSIS — J069 Acute upper respiratory infection, unspecified: Secondary | ICD-10-CM | POA: Insufficient documentation

## 2021-05-30 DIAGNOSIS — Z20822 Contact with and (suspected) exposure to covid-19: Secondary | ICD-10-CM | POA: Insufficient documentation

## 2021-05-30 DIAGNOSIS — R000 Tachycardia, unspecified: Secondary | ICD-10-CM | POA: Insufficient documentation

## 2021-05-30 DIAGNOSIS — R059 Cough, unspecified: Secondary | ICD-10-CM | POA: Diagnosis present

## 2021-05-30 DIAGNOSIS — J4521 Mild intermittent asthma with (acute) exacerbation: Secondary | ICD-10-CM | POA: Diagnosis not present

## 2021-05-30 LAB — GROUP A STREP BY PCR: Group A Strep by PCR: NOT DETECTED

## 2021-05-30 LAB — RESP PANEL BY RT-PCR (FLU A&B, COVID) ARPGX2
Influenza A by PCR: NEGATIVE
Influenza B by PCR: NEGATIVE
SARS Coronavirus 2 by RT PCR: NEGATIVE

## 2021-05-30 MED ORDER — IPRATROPIUM-ALBUTEROL 0.5-2.5 (3) MG/3ML IN SOLN
3.0000 mL | Freq: Once | RESPIRATORY_TRACT | Status: AC
  Administered 2021-05-30: 3 mL via RESPIRATORY_TRACT
  Filled 2021-05-30: qty 3

## 2021-05-30 MED ORDER — AZITHROMYCIN 250 MG PO TABS: ORAL_TABLET | ORAL | 0 refills | Status: DC

## 2021-05-30 MED ORDER — PREDNISONE 10 MG (21) PO TBPK: ORAL_TABLET | ORAL | 0 refills | Status: DC

## 2021-05-30 NOTE — Discharge Instructions (Addendum)
Follow-up with your regular doctor if not improving in 3 days.  Return emergency department worsening.  Take medication as prescribed 

## 2021-05-30 NOTE — ED Notes (Signed)
See triage note  presents with sore throat and ear pain which started couple of days ago denies any fever  states she feels nasally congested and noticed wheezing  used meds w/o relief  afebrile on arrival  scattered wheezing noted ?

## 2021-05-30 NOTE — ED Provider Notes (Signed)
? ?[facility] ?Provider Note ? ? ? Event Date/Time  ? First MD Initiated Contact with Patient 05/30/21 [phone]   ?  (approximate) ? ? ?History  ? ?Asthma ? ? ?HPI ? ?Courtney Le is a 51 y.o. female with history of asthma and diabetes presents emergency department complaining of sore throat x2 days, right ear pain, wheezing and cough and congestion.  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  Unsure if she has strep.  No fever that she knows of.  No vomiting or diarrhea.  Used her inhaler without relief. ? ?  ? ? ?Physical Exam  ? ?Triage Vital Signs: ?ED Triage Vitals  ?Enc Vitals Group  ?   BP 05/30/21 0842 109/66  ?   Pulse Rate 05/30/21 0842 (!) 118  ?   Resp 05/30/21 0842 20  ?   Temp 05/30/21 0842 98.5 ?F (36.9 ?C)  ?   Temp Source 05/30/21 0842 Oral  ?   SpO2 05/30/21 0842 98 %  ?   Weight 05/30/21 0837 200 lb (90.7 kg)  ?   Height 05/30/21 0837 4\' 11"  (1.499 m)  ?   Head Circumference --   ?   Peak Flow --   ?   Pain Score 05/30/21 0837 0  ?   Pain Loc --   ?   Pain Edu? --   ?   Excl. in GC? --   ? ? ?Most recent vital signs: ?Vitals:  ? 05/30/21 0842  ?BP: 109/66  ?Pulse: (!) 118  ?Resp: 20  ?Temp: 98.5 ?F (36.9 ?C)  ?SpO2: 98%  ? ? ? ?General: Awake, no distress.   ?CV:  Good peripheral perfusion.  Tachycardic, regular rhythm  ?resp:  Normal effort. Lungs with wheezing in the right lung ?Abd:  No distention.   ?Other:  Right TM is dull but no infection noted, throat has enlarged tonsils, no exudate noted ? ? ?ED Results / Procedures / Treatments  ? ?Labs ?(all labs ordered are listed, but only abnormal results are displayed) ?Labs Reviewed  ?RESP PANEL BY RT-PCR (FLU A&B, COVID) ARPGX2  ?GROUP A STREP BY PCR  ? ? ? ?EKG ? ? ? ? ?RADIOLOGY ?Chest x-ray ? ? ? ?PROCEDURES: ? ? ?Procedures ? ? ?MEDICATIONS ORDERED IN ED: ?Medications  ?ipratropium-albuterol (DUONEB) 0.5-2.5 (3) MG/3ML nebulizer solution 3 mL (3 mLs Nebulization Given  05/30/21 0918)  ? ? ? ?IMPRESSION / MDM / ASSESSMENT AND PLAN / ED COURSE  ?I reviewed the triage vital signs and the nursing notes. ?             ?               ? ?Differential diagnosis includes, but is not limited to, strep throat, COVID, influenza, acute bronchitis, asthma exasperation ? 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 ? ?X-ray was independently reviewed by me, I do not see any acute abnormality.  Confirmed by radiology ? ?On recheck of the patient she had a lot of relief with the DuoNeb.  No wheezing is noted in the right lung ? ?Strep test is negative, COVID test is negative. ? ?Patient has penicillin allergy so we will start her on a Z-Pak and steroid pack.  She is to follow-up with her regular doctor if not improving.  Use her inhalers and DuoNeb Nebules that  she has at home as needed.  Patient is in agreement treatment plan.  I did give her a work note for today.  She was discharged stable condition with strict instructions to return if worsening. ? ? ?  ? ? ?FINAL CLINICAL IMPRESSION(S) / ED DIAGNOSES  ? ?Final diagnoses:  ?Mild intermittent asthma with exacerbation  ?Acute URI  ? ? ? ?Rx / DC Orders  ? ?ED Discharge Orders   ? ?      Ordered  ?  predniSONE (STERAPRED UNI-PAK 21 TAB) 10 MG (21) TBPK tablet       ? 05/30/21 1024  ?  azithromycin (ZITHROMAX Z-PAK) 250 MG tablet       ? 05/30/21 1024  ? ?  ?  ? ?  ? ? ? ?Note:  This document was prepared using Dragon voice recognition software and may include unintentional dictation errors. ? ?  ?Faythe Ghee, PA-C ?05/30/21 1027 ? ?  ?Jene Every, MD ?05/30/21 1102 ? ?

## 2021-05-30 NOTE — ED Triage Notes (Signed)
Pt via POV from home. Pt c/o an asthma exab, states that it started yesterday. Pt has been using inhaler without any relief. Pt is A&Ox4, audible wheezing noted.  ?

## 2021-07-28 ENCOUNTER — Other Ambulatory Visit: Payer: Self-pay

## 2021-07-28 ENCOUNTER — Emergency Department
Admission: EM | Admit: 2021-07-28 | Discharge: 2021-07-28 | Disposition: A | Discharge status: Home / Self Care | Payer: PRIVATE HEALTH INSURANCE | Attending: Emergency Medicine | Admitting: Emergency Medicine

## 2021-07-28 ENCOUNTER — Emergency Department: Payer: PRIVATE HEALTH INSURANCE

## 2021-07-28 DIAGNOSIS — Y9389 Activity, other specified: Secondary | ICD-10-CM | POA: Insufficient documentation

## 2021-07-28 DIAGNOSIS — S93401A Sprain of unspecified ligament of right ankle, initial encounter: Secondary | ICD-10-CM | POA: Insufficient documentation

## 2021-07-28 DIAGNOSIS — J45909 Unspecified asthma, uncomplicated: Secondary | ICD-10-CM | POA: Insufficient documentation

## 2021-07-28 DIAGNOSIS — X58XXXA Exposure to other specified factors, initial encounter: Secondary | ICD-10-CM | POA: Insufficient documentation

## 2021-07-28 DIAGNOSIS — S93409A Sprain of unspecified ligament of unspecified ankle, initial encounter: Secondary | ICD-10-CM

## 2021-07-28 DIAGNOSIS — E119 Type 2 diabetes mellitus without complications: Secondary | ICD-10-CM | POA: Insufficient documentation

## 2021-07-28 NOTE — Discharge Instructions (Addendum)
Ice and elevation to reduce swelling and help with pain.  Continue with ibuprofen over-the-counter 3 times a day with food.  Use the Ace wrap as needed for protection and support.  Follow-up with your primary care provider if any continued problems.  X-rays were negative for fracture. ?

## 2021-07-28 NOTE — ED Triage Notes (Signed)
Pt states she was outside playing with her children on Friday and injured her right ankle, having pain and swelling since ?

## 2021-07-28 NOTE — ED Provider Notes (Signed)
? ?[facility] ?Provider Note ? ? ? Event Date/Time  ? First MD Initiated Contact with Patient 07/28/21 [phone]   ?  (approximate) ? ? ?History  ? ?Ankle Pain ? ? ?HPI ? ?Courtney Le is a 51 y.o. female   to the ED with complaint of right ankle pain.  Patient states that she was out with her children 3 days ago when she injured her ankle.  She states that she has had pain and swelling since that time.  Denies any previous ankle injuries.  She denies any head injury with her accident.  Patient has a history of asthma and diabetes non-insulin-dependent.  She rates her pain as 6/10. ? ?  ? ? ?Physical Exam  ? ?Triage Vital Signs: ?ED Triage Vitals  ?Enc Vitals Group  ?   BP 07/28/21 0818 (!) 157/90  ?   Pulse Rate 07/28/21 0818 100  ?   Resp 07/28/21 0818 17  ?   Temp 07/28/21 0818 98.5 ?F (36.9 ?C)  ?   Temp Source 07/28/21 0818 Oral  ?   SpO2 07/28/21 0818 97 %  ?   Weight 07/28/21 0815 195 lb (88.5 kg)  ?   Height 07/28/21 0815 4\' 11"  (1.499 m)  ?   Head Circumference --   ?   Peak Flow --   ?   Pain Score 07/28/21 0815 6  ?   Pain Loc --   ?   Pain Edu? --   ?   Excl. in GC? --   ? ? ?Most recent vital signs: ?Vitals:  ? 07/28/21 0818  ?BP: (!) 157/90  ?Pulse: 100  ?Resp: 17  ?Temp: 98.5 ?F (36.9 ?C)  ?SpO2: 97%  ? ? ? ?General: Awake, no distress.  ?CV:  Good peripheral perfusion.  ?Resp:  Normal effort.  ?Abd:  No distention.  ?Other:  On examination of the right ankle there is moderate soft tissue edema and tenderness especially to the medial aspect.  Range of motion is slow and guarded secondary to increased pain.  Skin is intact.  Pulses are present both DP and TP. ? ? ?ED Results / Procedures / Treatments  ? ?Labs ?(all labs ordered are listed, but only abnormal results are displayed) ?Labs Reviewed - No data to display ? ? ? ?RADIOLOGY ?Right ankle x-ray images were interpreted by myself and no fracture was noted.  Radiology report also officially is negative. ? ? ? ?PROCEDURES: Jones  wrap was applied by provider. ? ?Critical Care performed:  ? ?Procedures ? ? ?MEDICATIONS ORDERED IN ED: ?Medications - No data to display ? ? ?IMPRESSION / MDM / ASSESSMENT AND PLAN / ED COURSE  ?I reviewed the triage vital signs and the nursing notes. ? ? ?Differential diagnosis includes, but is not limited to, sprain right ankle, fracture right ankle, fracture right foot. ? ?51 year old female presents to the ED with complaint of right ankle pain after playing in the yard with children.  She states she injured her right ankle while playing and has continued to have pain and swelling since that time.  Physical exam was suspicious for a fracture as she was moderately tender to the medial malleolus.  X-rays of the right ankle failed to show a fracture and patient was told that most likely this is more of a sprained ankle.  She is encouraged to ice and elevate.  A Jones wrap was applied by this provider.  Patient will continue taking ibuprofen with food  to help with pain and inflammation.  She is to follow-up with her PCP if any continued problems or concerns. ? ? ? ?  ? ? ?FINAL CLINICAL IMPRESSION(S) / ED DIAGNOSES  ? ?Final diagnoses:  ?Sprain of ankle, unspecified laterality, unspecified ligament, initial encounter  ? ? ? ?Rx / DC Orders  ? ?ED Discharge Orders   ? ? None  ? ?  ? ? ? ?Note:  This document was prepared using Dragon voice recognition software and may include unintentional dictation errors. ?  ?Tommi Rumps, PA-C ?07/28/21 1428 ? ?  ?Sharman Cheek, MD ?07/28/21 1528 ? ?

## 2021-07-28 NOTE — ED Notes (Signed)
See triage note   states she was playing with her children  twisted right ankle  good pulses ?

## 2021-10-08 ENCOUNTER — Emergency Department
Admission: EM | Admit: 2021-10-08 | Discharge: 2021-10-08 | Disposition: A | Discharge status: Home / Self Care | Payer: PRIVATE HEALTH INSURANCE | Attending: Emergency Medicine | Admitting: Emergency Medicine

## 2021-10-08 ENCOUNTER — Emergency Department: Payer: PRIVATE HEALTH INSURANCE

## 2021-10-08 ENCOUNTER — Encounter: Payer: Self-pay | Admitting: Emergency Medicine

## 2021-10-08 ENCOUNTER — Other Ambulatory Visit: Payer: Self-pay

## 2021-10-08 DIAGNOSIS — R062 Wheezing: Secondary | ICD-10-CM | POA: Diagnosis present

## 2021-10-08 DIAGNOSIS — J4541 Moderate persistent asthma with (acute) exacerbation: Secondary | ICD-10-CM | POA: Diagnosis not present

## 2021-10-08 DIAGNOSIS — Z20822 Contact with and (suspected) exposure to covid-19: Secondary | ICD-10-CM | POA: Insufficient documentation

## 2021-10-08 DIAGNOSIS — E119 Type 2 diabetes mellitus without complications: Secondary | ICD-10-CM | POA: Diagnosis not present

## 2021-10-08 LAB — RESP PANEL BY RT-PCR (FLU A&B, COVID) ARPGX2
Influenza A by PCR: NEGATIVE
Influenza B by PCR: NEGATIVE
SARS Coronavirus 2 by RT PCR: NEGATIVE

## 2021-10-08 MED ORDER — IPRATROPIUM-ALBUTEROL 0.5-2.5 (3) MG/3ML IN SOLN
3.0000 mL | Freq: Once | RESPIRATORY_TRACT | Status: AC
  Administered 2021-10-08: 3 mL via RESPIRATORY_TRACT
  Filled 2021-10-08: qty 3

## 2021-10-08 MED ORDER — PREDNISONE 10 MG PO TABS: 50.0000 mg | ORAL_TABLET | Freq: Every day | ORAL | 0 refills | Status: DC

## 2021-10-08 MED ORDER — PREDNISONE 20 MG PO TABS
60.0000 mg | ORAL_TABLET | Freq: Once | ORAL | Status: AC
Start: 2021-10-08 — End: 2021-10-08
  Administered 2021-10-08: 60 mg via ORAL
  Filled 2021-10-08: qty 3

## 2021-10-08 NOTE — ED Provider Notes (Signed)
[facility] Provider Note    Event Date/Time   First MD Initiated Contact with Patient 10/08/21 1004     (approximate)   History   Asthma   HPI  Courtney Le is a 51 y.o. female presents to the emergency department for treatment and evaluation of 2 days of increased wheezing.  No fever.  Daughter has tested positive for COVID.  No relief with her albuterol.  Past Medical History:  Diagnosis Date   Asthma    Diabetes mellitus without complication [facility])      Physical Exam   Triage Vital Signs: ED Triage Vitals  Enc Vitals Group     BP 10/08/21 0933 (!) 146/83     Pulse Rate 10/08/21 0933 96     Resp 10/08/21 0933 16     Temp 10/08/21 0933 98.3 F (36.8 C)     Temp src --      SpO2 10/08/21 0933 100 %     Weight 10/08/21 0934 195 lb (88.5 kg)     Height 10/08/21 0934 4\' 11"  (1.499 m)     Head Circumference --      Peak Flow --      Pain Score 10/08/21 0934 0     Pain Loc --      Pain Edu? --      Excl. in GC? --     Most recent vital signs: Vitals:   10/08/21 0933 10/08/21 1210  BP: (!) 146/83 127/81  Pulse: 96 96  Resp: 16 16  Temp: 98.3 F (36.8 C)   SpO2: 100% 99%    General: Awake, no distress.  CV:  Good peripheral perfusion.  Resp:  Normal effort.  Abd:  No distention.  Other:  Diffuse wheezing throughout.   ED Results / Procedures / Treatments   Labs (all labs ordered are listed, but only abnormal results are displayed) Labs Reviewed  RESP PANEL BY RT-PCR (FLU A&B, COVID) ARPGX2     EKG  Not indicated   RADIOLOGY  Chest x-ray negative for acute concern.  I have independently reviewed and interpreted imaging as well as reviewed report from radiology.  PROCEDURES:  Critical Care performed: No  Procedures   MEDICATIONS ORDERED IN ED:  Medications  ipratropium-albuterol (DUONEB) 0.5-2.5 (3) MG/3ML nebulizer solution 3 mL (3 mLs Nebulization Given 10/08/21 0956)  predniSONE (DELTASONE) tablet 60 mg  (60 mg Oral Given 10/08/21 1118)  ipratropium-albuterol (DUONEB) 0.5-2.5 (3) MG/3ML nebulizer solution 3 mL (3 mLs Nebulization Given 10/08/21 1118)     IMPRESSION / MDM / ASSESSMENT AND PLAN / ED COURSE   I reviewed the triage vital signs and the nursing notes.  Differential diagnosis includes, but is not limited to: COVID, influenza, URI, asthma exacerbation.  Patient's presentation is most consistent with acute complicated illness / injury requiring diagnostic workup.  51 year old female presenting to the emergency department for treatment and evaluation of wheezing not relieved by her home treatments.  See HPI for further details.  COVID test is pending.  Covid test negative. Wheezing resolved after second duo-neb. Will discharge home with burst dose of prednisone and have her continue her albuterol. She is to follow up with her primary care provider or return to the ER for concerns.     FINAL CLINICAL IMPRESSION(S) / ED DIAGNOSES   Final diagnoses:  Moderate persistent asthma with exacerbation     Rx / DC Orders   ED Discharge Orders  Ordered    predniSONE (DELTASONE) 10 MG tablet  Daily        10/08/21 1200             Note:  This document was prepared using Dragon voice recognition software and may include unintentional dictation errors.   Chinita Pester, FNP 10/10/21 0945    Sharman Cheek, MD 10/14/21 [phone]

## 2021-10-08 NOTE — ED Triage Notes (Signed)
Says asthma with increased wheezibng since last night.  Takes albuterol inh and nebs prn, but no relief.

## 2022-02-13 DIAGNOSIS — Z419 Encounter for procedure for purposes other than remedying health state, unspecified: Secondary | ICD-10-CM | POA: Diagnosis not present

## 2022-03-16 ENCOUNTER — Emergency Department: Payer: Medicaid Other

## 2022-03-16 ENCOUNTER — Encounter: Payer: Self-pay | Admitting: Emergency Medicine

## 2022-03-16 ENCOUNTER — Emergency Department
Admission: EM | Admit: 2022-03-16 | Discharge: 2022-03-17 | Disposition: A | Discharge status: Home / Self Care | Payer: Medicaid Other | Attending: Emergency Medicine | Admitting: Emergency Medicine

## 2022-03-16 DIAGNOSIS — Z1152 Encounter for screening for COVID-19: Secondary | ICD-10-CM | POA: Insufficient documentation

## 2022-03-16 DIAGNOSIS — J4540 Moderate persistent asthma, uncomplicated: Secondary | ICD-10-CM

## 2022-03-16 DIAGNOSIS — E119 Type 2 diabetes mellitus without complications: Secondary | ICD-10-CM | POA: Insufficient documentation

## 2022-03-16 DIAGNOSIS — J4541 Moderate persistent asthma with (acute) exacerbation: Secondary | ICD-10-CM | POA: Insufficient documentation

## 2022-03-16 DIAGNOSIS — J45909 Unspecified asthma, uncomplicated: Secondary | ICD-10-CM | POA: Diagnosis not present

## 2022-03-16 DIAGNOSIS — Z419 Encounter for procedure for purposes other than remedying health state, unspecified: Secondary | ICD-10-CM | POA: Diagnosis not present

## 2022-03-16 DIAGNOSIS — R059 Cough, unspecified: Secondary | ICD-10-CM | POA: Diagnosis present

## 2022-03-16 LAB — RESP PANEL BY RT-PCR (RSV, FLU A&B, COVID)  RVPGX2
Influenza A by PCR: NEGATIVE
Influenza B by PCR: NEGATIVE
Resp Syncytial Virus by PCR: NEGATIVE
SARS Coronavirus 2 by RT PCR: NEGATIVE

## 2022-03-16 MED ORDER — PREDNISONE 20 MG PO TABS
60.0000 mg | ORAL_TABLET | Freq: Once | ORAL | Status: AC
  Administered 2022-03-16: 60 mg via ORAL
  Filled 2022-03-16: qty 3

## 2022-03-16 MED ORDER — IPRATROPIUM-ALBUTEROL 0.5-2.5 (3) MG/3ML IN SOLN
3.0000 mL | Freq: Once | RESPIRATORY_TRACT | Status: AC
  Administered 2022-03-16: 3 mL via RESPIRATORY_TRACT

## 2022-03-16 MED ORDER — IPRATROPIUM-ALBUTEROL 0.5-2.5 (3) MG/3ML IN SOLN
3.0000 mL | Freq: Once | RESPIRATORY_TRACT | Status: AC
  Administered 2022-03-16: 3 mL via RESPIRATORY_TRACT
  Filled 2022-03-16: qty 3

## 2022-03-16 NOTE — ED Provider Notes (Incomplete)
[facility] Provider Note    None    (approximate)   History   Cough   HPI  Courtney Le is a 52 y.o. female        Physical Exam   Triage Vital Signs: ED Triage Vitals  Enc Vitals Group     BP 03/16/22 2228 (!) 142/101     Pulse Rate 03/16/22 2228 (!) 108     Resp 03/16/22 2228 (!) 24     Temp 03/16/22 2228 98.7 F (37.1 C)     Temp Source 03/16/22 2228 Oral     SpO2 03/16/22 2228 96 %     Weight 03/16/22 2231 195 lb (88.5 kg)     Height 03/16/22 2231 4\' 11"  (1.499 m)     Head Circumference --      Peak Flow --      Pain Score --      Pain Loc --      Pain Edu? --      Excl. in [place]? --     Most recent vital signs: Vitals:   03/16/22 2228  BP: (!) 142/101  Pulse: (!) 108  Resp: (!) 24  Temp: 98.7 F (37.1 C)  SpO2: 96%    {Only need to document appropriate and relevant physical exam:1} General: Awake, no distress. *** CV:  Good peripheral perfusion. *** Resp:  Normal effort. *** Abd:  No distention. *** Other:  ***   ED Results / Procedures / Treatments   Labs (all labs ordered are listed, but only abnormal results are displayed) Labs Reviewed  RESP PANEL BY RT-PCR (RSV, FLU A&B, COVID)  RVPGX2     EKG  ***   RADIOLOGY *** {USE THE WORD "INTERPRETED"!! You MUST document your own interpretation of imaging, as well as the fact that you reviewed the radiologist's report!:1}   PROCEDURES:  Critical Care performed: {CriticalCareYesNo:19197::"Yes, see critical care procedure note(s)","No"}  Procedures   MEDICATIONS ORDERED IN ED: Medications - No data to display   IMPRESSION / MDM / [place] / ED COURSE  I reviewed the triage vital signs and the nursing notes.                              Differential diagnosis includes, but is not limited to, ***  Patient's presentation is most consistent with {EM COPA:27473}  *** {If the patient is on the monitor, remove the brackets and asterisks on  the sentence below and remember to document it as a Procedure as well. Otherwise delete the sentence below:1} {**The patient is on the cardiac monitor to evaluate for evidence of arrhythmia and/or significant heart rate changes.**} {Remember to include, when applicable, any/all of the following data: independent review of imaging independent review of labs (comment specifically on pertinent positives and negatives) review of specific prior hospitalizations, PCP/specialist notes, etc. discuss meds given and prescribed document any discussion with consultants (including hospitalists) any clinical decision tools you used and why (PECARN, NEXUS, etc.) did you consider admitting the patient? document social determinants of health affecting patient's care (homelessness, inability to follow up in a timely fashion, etc) document any pre-existing conditions increasing risk on current visit (e.g. diabetes and HTN increasing danger of high-risk chest pain/ACS) describes what meds you gave (especially parenteral) and why any other interventions?:1}     FINAL CLINICAL IMPRESSION(S) / ED DIAGNOSES   Final diagnoses:  None  Rx / DC Orders   ED Discharge Orders     None        Note:  This document was prepared using Dragon voice recognition software and may include unintentional dictation errors.

## 2022-03-16 NOTE — ED Provider Notes (Signed)
[facility] Provider Note    None    (approximate)   History   Cough   HPI  Courtney Le is a 52 y.o. female with history of asthma and diabetes presents to the emergency department for treatment and evaluation of asthma exacerbation.  Cough and inspiratory wheezing has been present for the past week with no relief from inhalers.  She has had no known fever.  No nausea, vomiting, body aches.     Physical Exam   Triage Vital Signs: ED Triage Vitals  Enc Vitals Group     BP 03/16/22 2228 (!) 142/101     Pulse Rate 03/16/22 2228 (!) 108     Resp 03/16/22 2228 (!) 24     Temp 03/16/22 2228 98.7 F (37.1 C)     Temp Source 03/16/22 2228 Oral     SpO2 03/16/22 2228 96 %     Weight 03/16/22 2231 195 lb (88.5 kg)     Height 03/16/22 2231 4\' 11"  (1.499 m)     Head Circumference --      Peak Flow --      Pain Score --      Pain Loc --      Pain Edu? --      Excl. in [place]? --     Most recent vital signs: Vitals:   03/16/22 2228  BP: (!) 142/101  Pulse: (!) 108  Resp: (!) 24  Temp: 98.7 F (37.1 C)  SpO2: 96%     General: Awake, no distress.  CV:  Good peripheral perfusion.  Resp:  Normal effort.  Diffuse wheezing Abd:  No distention.  Other:     ED Results / Procedures / Treatments   Labs (all labs ordered are listed, but only abnormal results are displayed) Labs Reviewed  RESP PANEL BY RT-PCR (RSV, FLU A&B, COVID)  RVPGX2     EKG  Not indicated   RADIOLOGY  Chest x-ray viewed and interpreted by me.  No acute cardiopulmonary abnormality identified.  Radiology report consistent with the same.   PROCEDURES:  Critical Care performed: No  Procedures   MEDICATIONS ORDERED IN ED: Medications  ipratropium-albuterol (DUONEB) 0.5-2.5 (3) MG/3ML nebulizer solution 3 mL (3 mLs Nebulization Given 03/16/22 2341)  predniSONE (DELTASONE) tablet 60 mg (60 mg Oral Given 03/16/22 2341)  ipratropium-albuterol (DUONEB) 0.5-2.5 (3) MG/3ML  nebulizer solution 3 mL (3 mLs Nebulization Given 03/16/22 2343)     IMPRESSION / MDM / ASSESSMENT AND PLAN / ED COURSE  I reviewed the triage vital signs and the nursing notes.                              Differential diagnosis includes, but is not limited to, asthma exacerbation, COPD, COVID, influenza, RSV, acute viral syndrome.  Patient's presentation is most consistent with acute complicated illness / injury requiring diagnostic workup.  52 year old female presenting to the emergency department for treatment and evaluation of persistent wheezing for the past week despite use of her albuterol inhaler.  See HPI for further details.  On exam, she has audible wheezing.  COVID, influenza, and RSV testing is pending.  Plan will be to give her DuoNeb treatments, prednisone, and review test results.  COVID, influenza, and RSV testing are negative.  Chest x-ray is without acute concerns.  Plan will be to to discharge her home with a burst dose of prednisone and refill her albuterol.  She is to call and schedule follow-up appointment with her primary care provider.  ER return precautions discussed as well.     FINAL CLINICAL IMPRESSION(S) / ED DIAGNOSES   Final diagnoses:  Moderate persistent asthma, unspecified whether complicated     Rx / DC Orders   ED Discharge Orders          Ordered    predniSONE (DELTASONE) 10 MG tablet  Daily        03/17/22 0008             Note:  This document was prepared using Dragon voice recognition software and may include unintentional dictation errors.   Victorino Dike, FNP 03/23/22 2315    Harvest Dark, MD 03/26/22 1335

## 2022-03-16 NOTE — ED Triage Notes (Signed)
Pt with hx/o asthma to ED tonight due to continued cough and inspiratory wheezing x1 week with no relief by prescribed albuterol inhaler.

## 2022-03-17 MED ORDER — PREDNISONE 10 MG PO TABS: 50.0000 mg | ORAL_TABLET | Freq: Every day | ORAL | 0 refills | Status: AC

## 2022-03-19 DIAGNOSIS — F329 Major depressive disorder, single episode, unspecified: Secondary | ICD-10-CM | POA: Diagnosis not present

## 2022-03-19 DIAGNOSIS — R809 Proteinuria, unspecified: Secondary | ICD-10-CM | POA: Diagnosis not present

## 2022-03-19 DIAGNOSIS — J4541 Moderate persistent asthma with (acute) exacerbation: Secondary | ICD-10-CM | POA: Diagnosis not present

## 2022-03-19 DIAGNOSIS — Z23 Encounter for immunization: Secondary | ICD-10-CM | POA: Diagnosis not present

## 2022-03-19 DIAGNOSIS — E1129 Type 2 diabetes mellitus with other diabetic kidney complication: Secondary | ICD-10-CM | POA: Diagnosis not present

## 2022-03-23 DIAGNOSIS — H2513 Age-related nuclear cataract, bilateral: Secondary | ICD-10-CM | POA: Diagnosis not present

## 2022-03-23 DIAGNOSIS — E119 Type 2 diabetes mellitus without complications: Secondary | ICD-10-CM | POA: Diagnosis not present

## 2022-03-23 DIAGNOSIS — E1169 Type 2 diabetes mellitus with other specified complication: Secondary | ICD-10-CM | POA: Diagnosis not present

## 2022-03-23 DIAGNOSIS — E669 Obesity, unspecified: Secondary | ICD-10-CM | POA: Diagnosis not present

## 2022-04-16 DIAGNOSIS — Z419 Encounter for procedure for purposes other than remedying health state, unspecified: Secondary | ICD-10-CM | POA: Diagnosis not present

## 2022-05-15 DIAGNOSIS — Z419 Encounter for procedure for purposes other than remedying health state, unspecified: Secondary | ICD-10-CM | POA: Diagnosis not present

## 2022-06-05 DIAGNOSIS — E1129 Type 2 diabetes mellitus with other diabetic kidney complication: Secondary | ICD-10-CM | POA: Diagnosis not present

## 2022-06-05 DIAGNOSIS — R809 Proteinuria, unspecified: Secondary | ICD-10-CM | POA: Diagnosis not present

## 2022-06-05 DIAGNOSIS — Z1211 Encounter for screening for malignant neoplasm of colon: Secondary | ICD-10-CM | POA: Diagnosis not present

## 2022-06-05 DIAGNOSIS — S46012A Strain of muscle(s) and tendon(s) of the rotator cuff of left shoulder, initial encounter: Secondary | ICD-10-CM | POA: Diagnosis not present

## 2022-06-15 DIAGNOSIS — Z419 Encounter for procedure for purposes other than remedying health state, unspecified: Secondary | ICD-10-CM | POA: Diagnosis not present

## 2022-06-18 DIAGNOSIS — Z1231 Encounter for screening mammogram for malignant neoplasm of breast: Secondary | ICD-10-CM | POA: Diagnosis not present

## 2022-06-18 DIAGNOSIS — Z006 Encounter for examination for normal comparison and control in clinical research program: Secondary | ICD-10-CM | POA: Diagnosis not present

## 2022-07-02 DIAGNOSIS — Z139 Encounter for screening, unspecified: Secondary | ICD-10-CM | POA: Diagnosis not present

## 2022-07-02 DIAGNOSIS — N898 Other specified noninflammatory disorders of vagina: Secondary | ICD-10-CM | POA: Diagnosis not present

## 2022-07-15 DIAGNOSIS — Z419 Encounter for procedure for purposes other than remedying health state, unspecified: Secondary | ICD-10-CM | POA: Diagnosis not present

## 2022-11-08 IMAGING — CR DG CHEST 2V
1 series · 2 of 2 positions shown · non-contrast
Comparison: 03/26/2021

CLINICAL DATA: Cough, wheezing

EXAM:
CHEST - 2 VIEW

[Series 1: dg chest 2 view · 0.14mm/px · 2 of 2 slices shown]
[im 1/2]
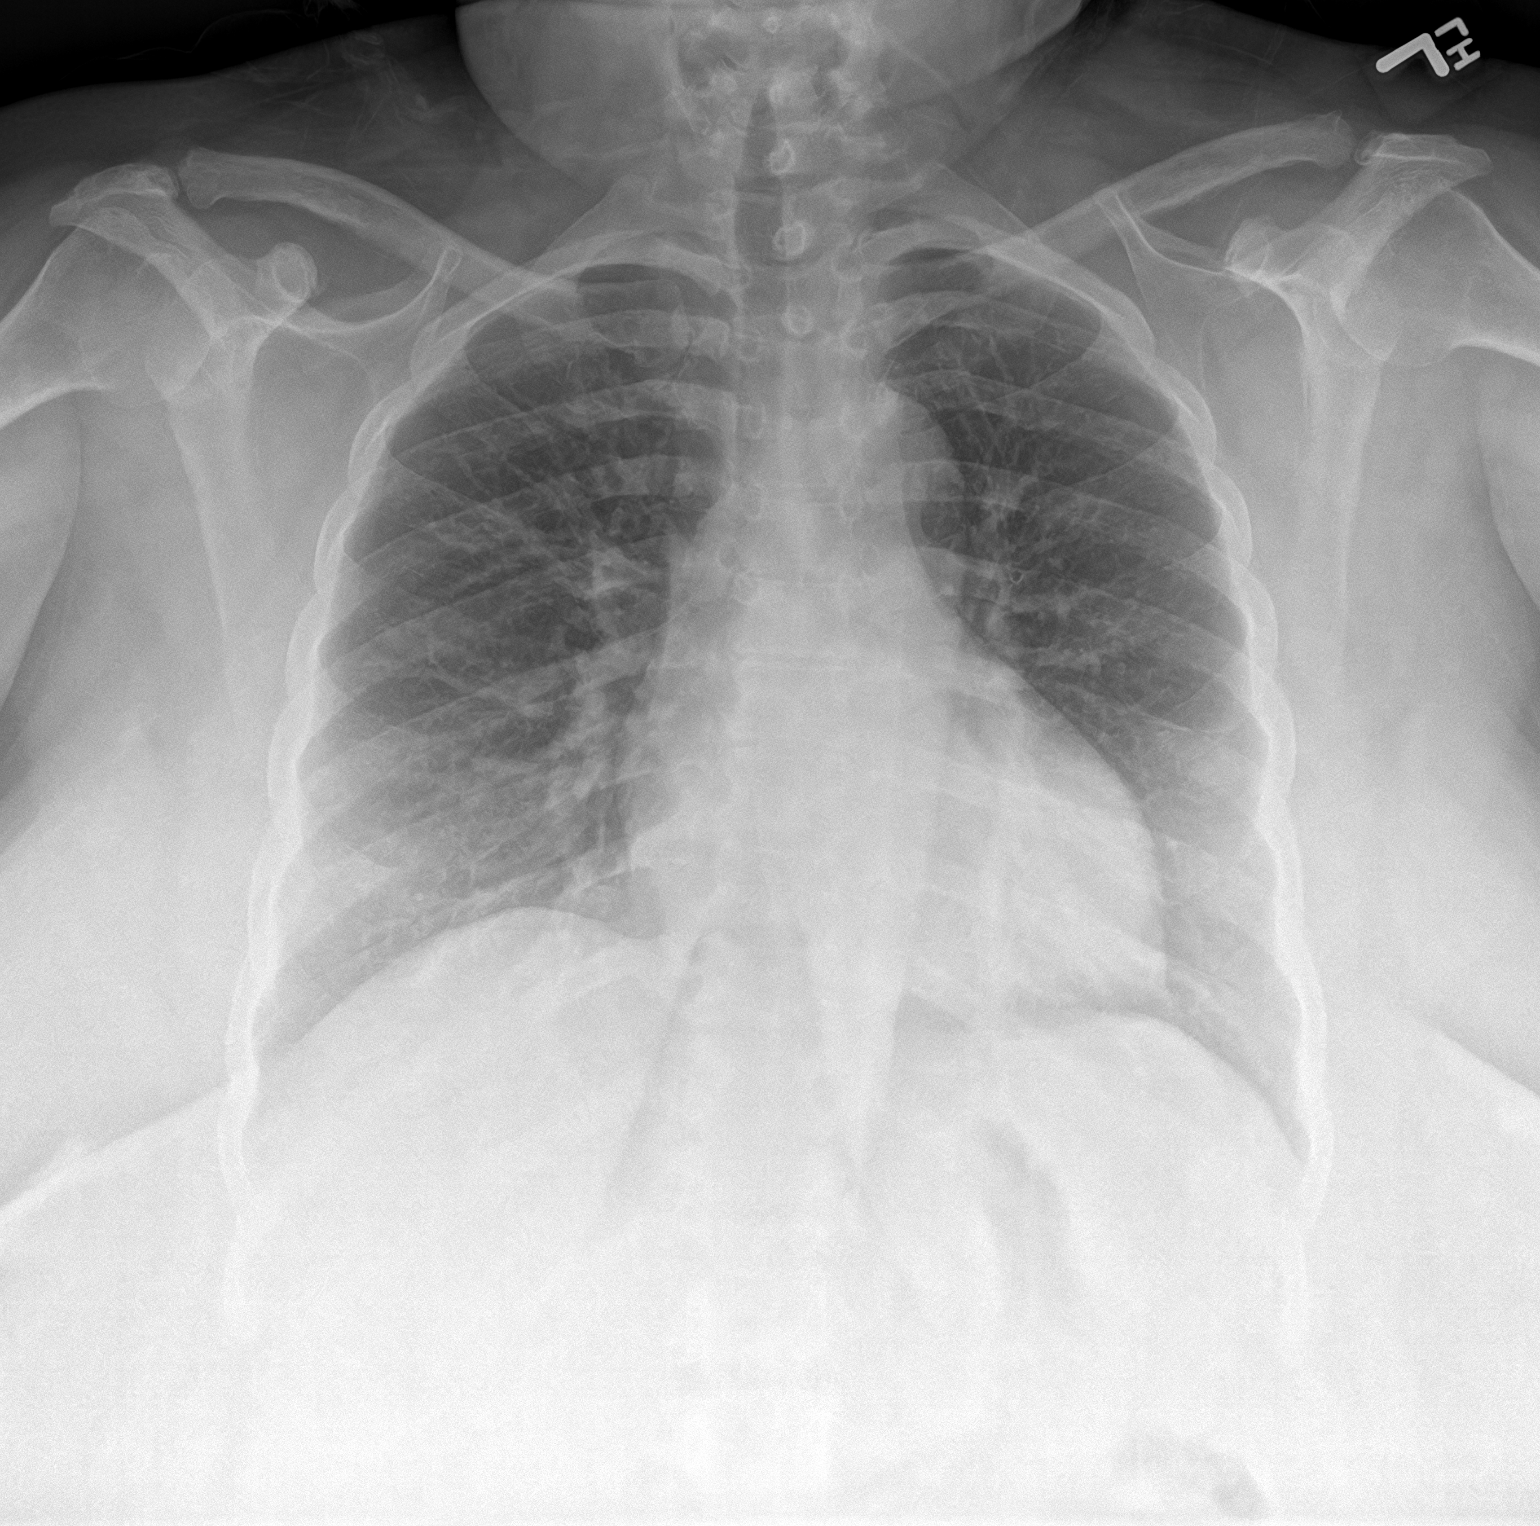
[im 2/2]
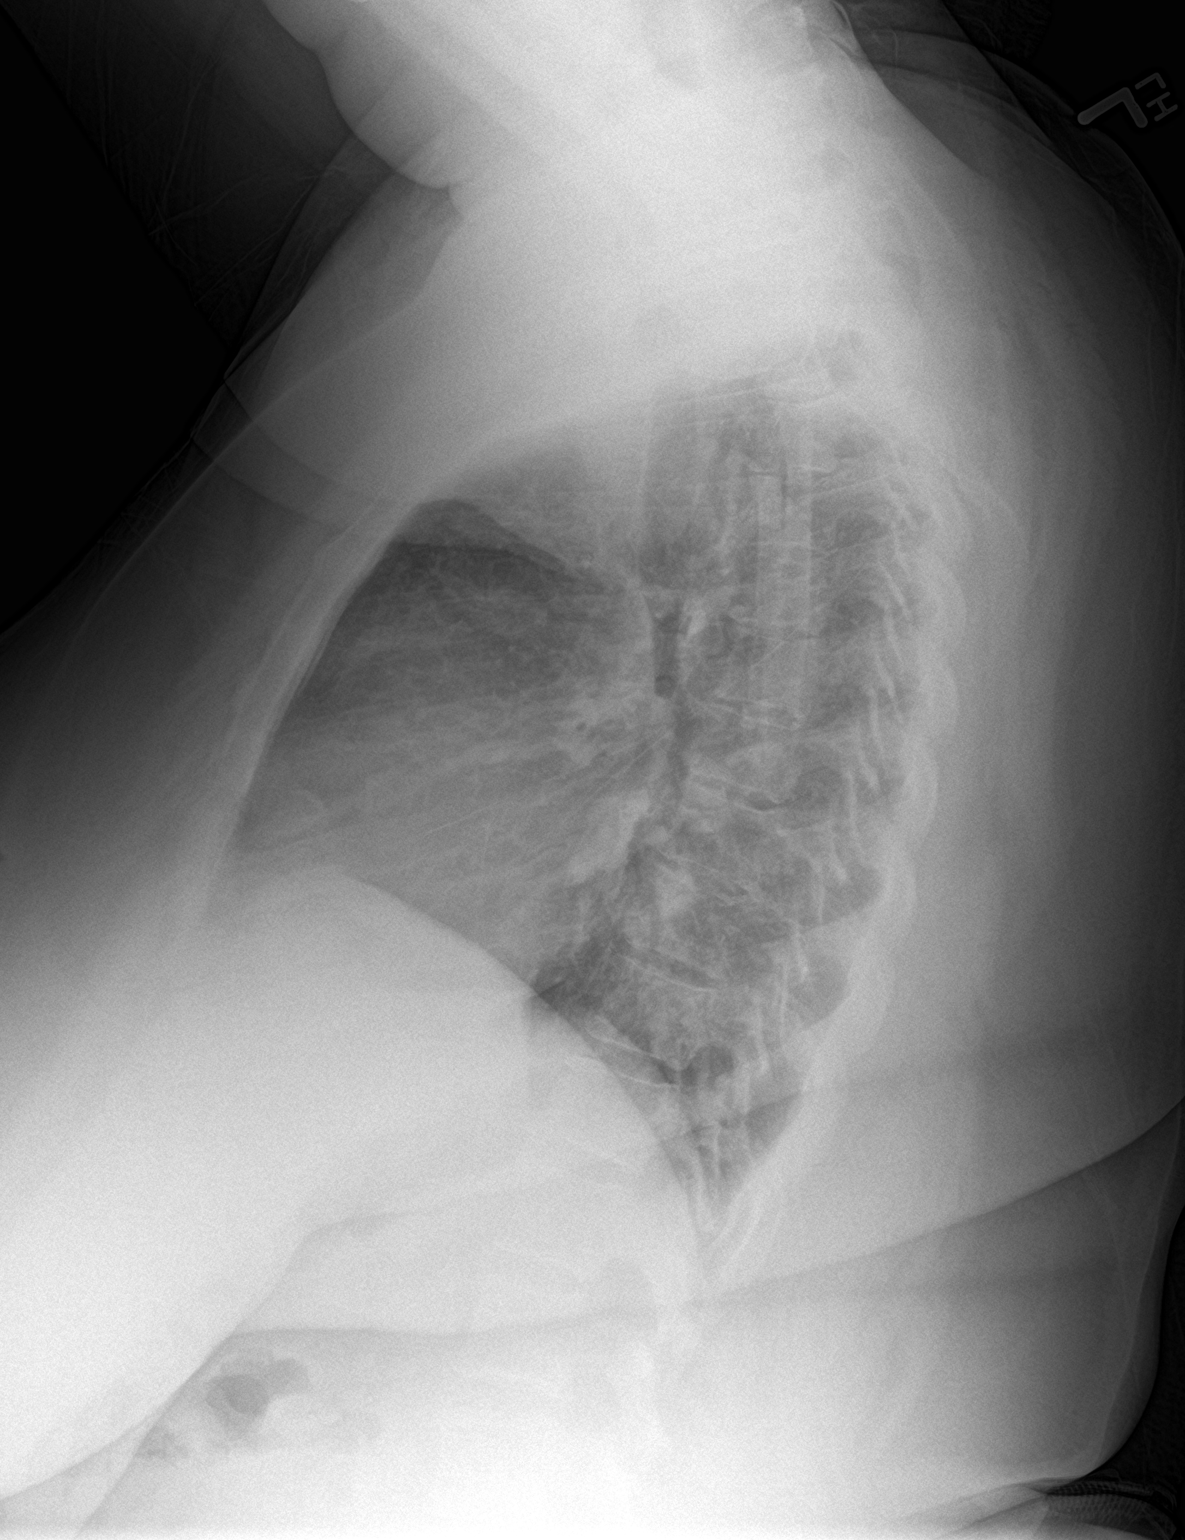

[2 of 2 positions shown; findings below may reference images not displayed]

FINDINGS: The heart size and mediastinal contours are within normal limits.
Both lungs are clear. The visualized skeletal structures are
unremarkable.
IMPRESSION: No active cardiopulmonary disease.

## 2023-01-06 IMAGING — CR DG ANKLE COMPLETE 3+V*R*
1 series · 3 of 3 positions shown · non-contrast
Comparison: None Available.

CLINICAL DATA: Right ankle pain, injury

EXAM:
RIGHT ANKLE - COMPLETE 3+ VIEW

[Series 1: dg ankle complete right · 0.14mm/px · 3 of 3 slices shown]
[im 1/3]
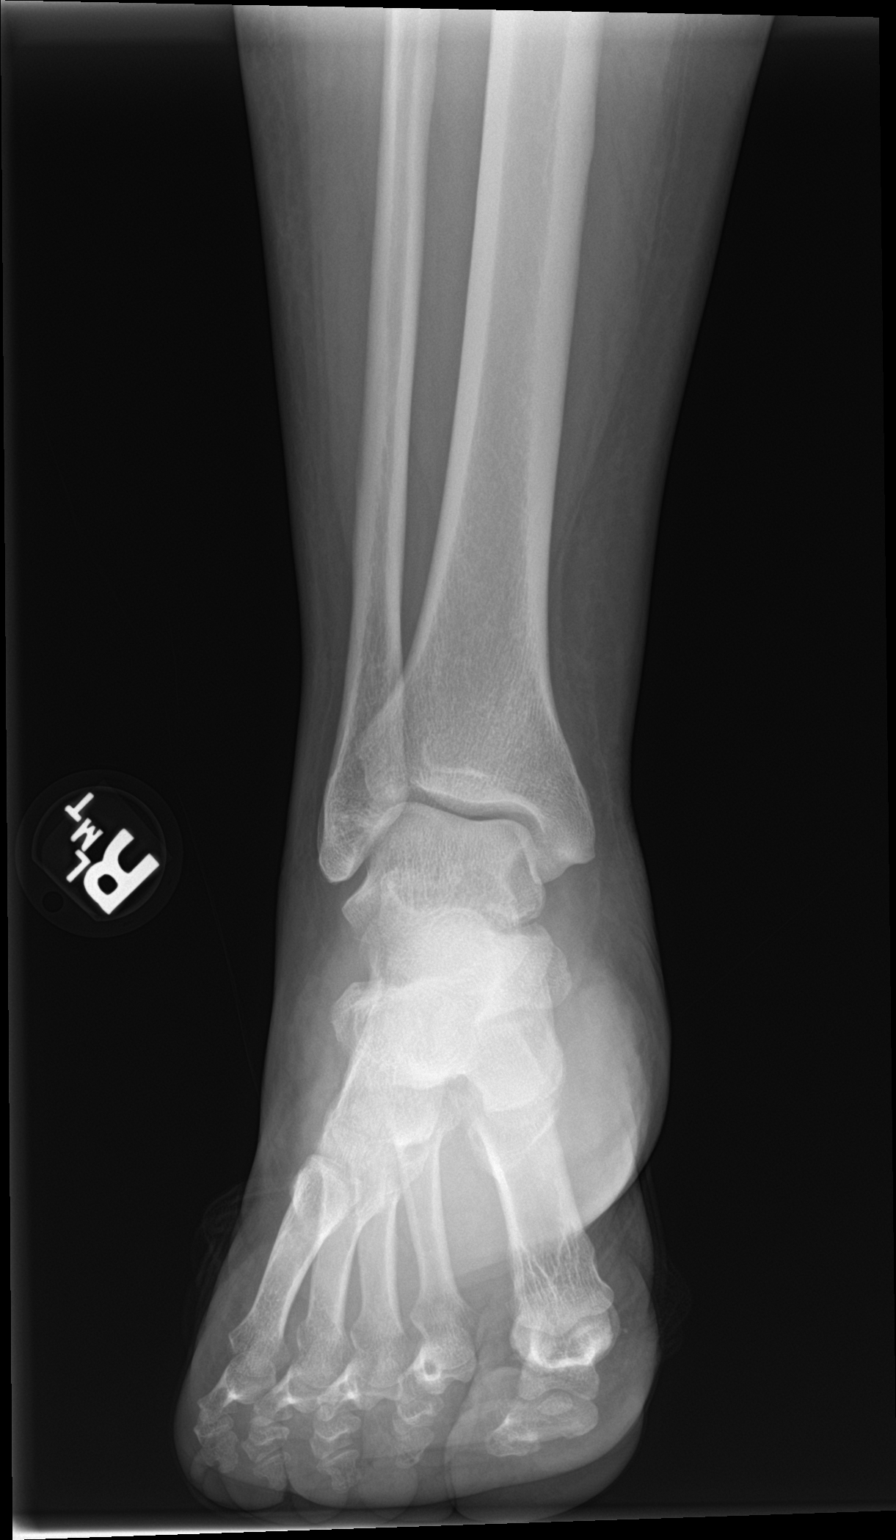
[im 2/3]
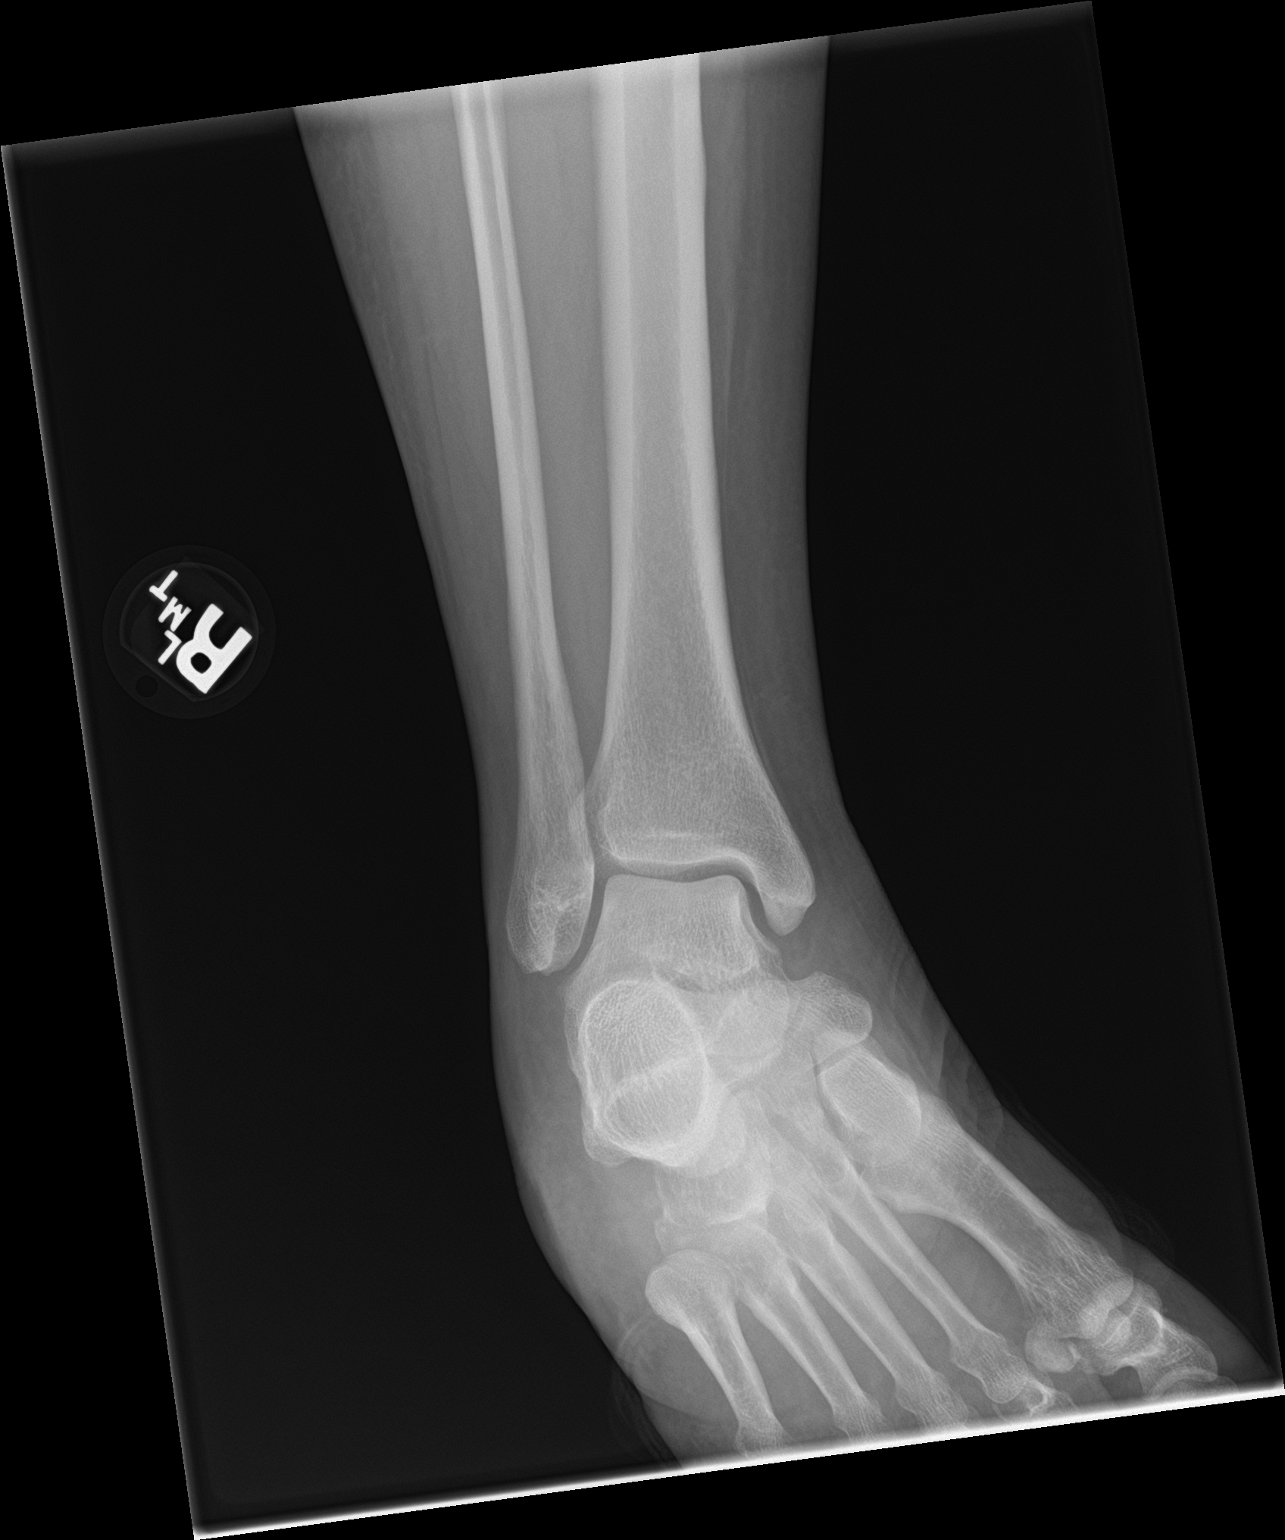
[im 3/3]
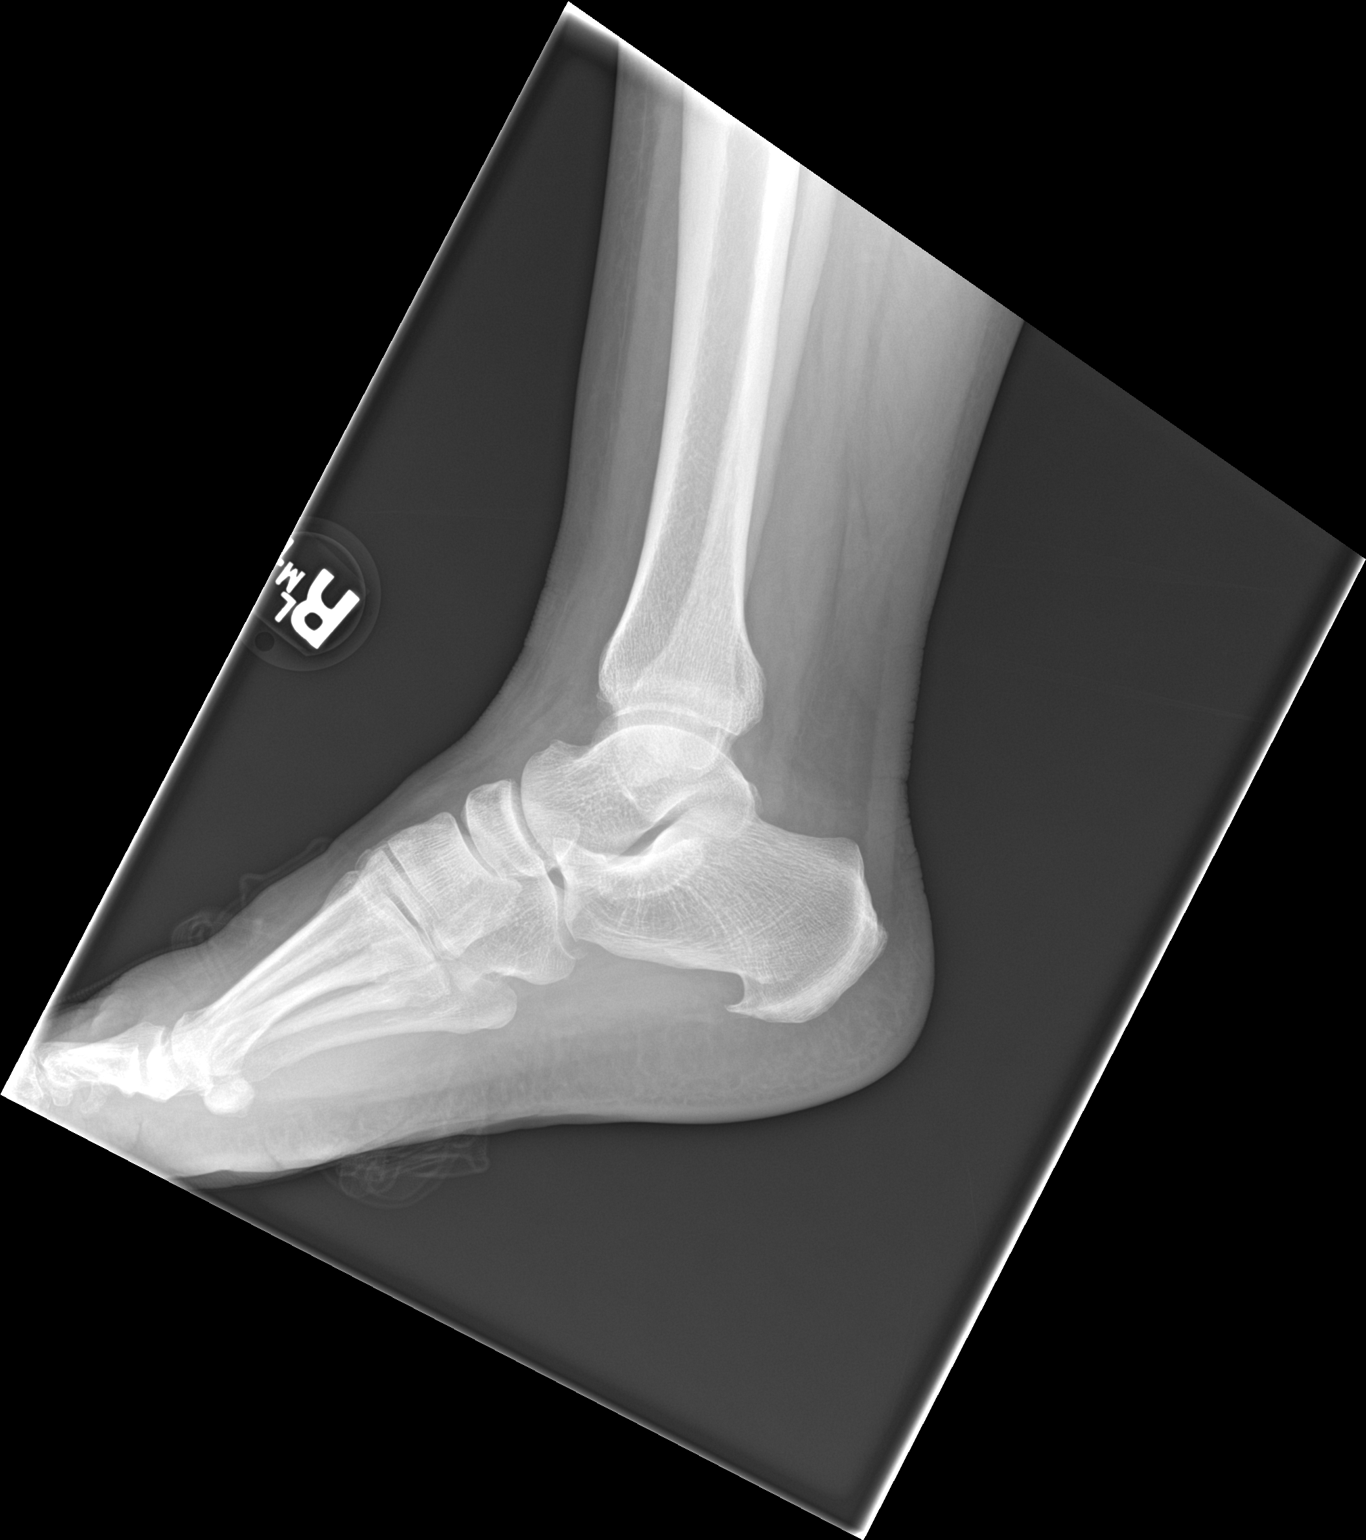

[3 of 3 positions shown; findings below may reference images not displayed]

FINDINGS: There is no evidence of fracture, dislocation, or joint effusion.
Small plantar calcaneal spur. There is no evidence of arthropathy or
other focal bone abnormality. Soft tissues are unremarkable.
IMPRESSION: Negative.
